# Patient Record
Sex: Male | Born: 2000 | Race: White | Hispanic: No | Marital: Single | Smoking: Never smoker
Health system: Southern US, Community
[De-identification: ages and names within clinical notes are randomized; demographics above are authoritative.]

## PROBLEM LIST (undated history)

## (undated) HISTORY — PX: HYDROCELE EXCISION / REPAIR: SUR1145

## (undated) HISTORY — PX: ASD REPAIR: SHX258

## (undated) HISTORY — PX: HERNIA REPAIR: SHX51

---
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
## 2021-01-28 ENCOUNTER — Emergency Department (HOSPITAL_COMMUNITY): Payer: Medicaid Other

## 2021-01-28 ENCOUNTER — Emergency Department (HOSPITAL_COMMUNITY)
Admission: EM | Admit: 2021-01-28 | Discharge: 2021-01-28 | Disposition: A | Discharge status: Home / Self Care | Payer: Medicaid Other | Attending: Emergency Medicine | Admitting: Emergency Medicine

## 2021-01-28 DIAGNOSIS — H547 Unspecified visual loss: Secondary | ICD-10-CM | POA: Insufficient documentation

## 2021-01-28 DIAGNOSIS — R509 Fever, unspecified: Secondary | ICD-10-CM | POA: Insufficient documentation

## 2021-01-28 DIAGNOSIS — G43909 Migraine, unspecified, not intractable, without status migrainosus: Secondary | ICD-10-CM | POA: Diagnosis not present

## 2021-01-28 DIAGNOSIS — R519 Headache, unspecified: Secondary | ICD-10-CM | POA: Insufficient documentation

## 2021-01-28 DIAGNOSIS — Z5321 Procedure and treatment not carried out due to patient leaving prior to being seen by health care provider: Secondary | ICD-10-CM | POA: Diagnosis not present

## 2021-01-28 LAB — COMPREHENSIVE METABOLIC PANEL
ALT: 21 U/L (ref 0–44)
AST: 24 U/L (ref 15–41)
Albumin: 4.2 g/dL (ref 3.5–5.0)
Alkaline Phosphatase: 73 U/L (ref 38–126)
Anion gap: 9 (ref 5–15)
BUN: 11 mg/dL (ref 6–20)
CO2: 22 mmol/L (ref 22–32)
Calcium: 8.9 mg/dL (ref 8.9–10.3)
Chloride: 106 mmol/L (ref 98–111)
Creatinine, Ser: 1.15 mg/dL (ref 0.61–1.24)
GFR, Estimated: >60 mL/min (ref >60)
Glucose, Bld: 103 mg/dL — ABNORMAL HIGH (ref 70–99)
Potassium: 4 mmol/L (ref 3.5–5.1)
Sodium: 137 mmol/L (ref 135–145)
Total Bilirubin: 1.2 mg/dL (ref 0.3–1.2)
Total Protein: 7.2 g/dL (ref 6.5–8.1)

## 2021-01-28 LAB — LACTIC ACID, PLASMA: Lactic Acid, Venous: 0.7 mmol/L (ref 0.5–1.9)

## 2021-01-28 LAB — CBC WITH DIFFERENTIAL/PLATELET
Abs Immature Granulocytes: 0.01 10*3/uL (ref 0.00–0.07)
Basophils Absolute: 0 10*3/uL (ref 0.0–0.1)
Basophils Relative: 0 %
Eosinophils Absolute: 0.2 10*3/uL (ref 0.0–0.5)
Eosinophils Relative: 3 %
HCT: 48.5 % (ref 39.0–52.0)
Hemoglobin: 16.3 g/dL (ref 13.0–17.0)
Immature Granulocytes: 0 %
Lymphocytes Relative: 14 %
Lymphs Abs: 0.8 10*3/uL (ref 0.7–4.0)
MCH: 29 pg (ref 26.0–34.0)
MCHC: 33.6 g/dL (ref 30.0–36.0)
MCV: 86.3 fL (ref 80.0–100.0)
Monocytes Absolute: 0.8 10*3/uL (ref 0.1–1.0)
Monocytes Relative: 14 %
Neutro Abs: 4 10*3/uL (ref 1.7–7.7)
Neutrophils Relative %: 69 %
Platelets: 157 10*3/uL (ref 150–400)
RBC: 5.62 MIL/uL (ref 4.22–5.81)
RDW: 12 % (ref 11.5–15.5)
WBC: 5.8 10*3/uL (ref 4.0–10.5)
nRBC: 0 % (ref 0.0–0.2)

## 2021-01-28 NOTE — ED Notes (Signed)
Pt stated he wanted to check himself out due to waiting so long. Pt seen leaving out the door

## 2021-01-28 NOTE — ED Triage Notes (Signed)
EMS stated, This morning he was running a fever and loss of vision for 30 seconds. This happened before and dx as migraine headache per mom.

## 2022-02-14 ENCOUNTER — Emergency Department (HOSPITAL_COMMUNITY)
Admission: EM | Admit: 2022-02-14 | Discharge: 2022-02-14 | Disposition: A | Discharge status: Home / Self Care | Payer: Self-pay | Attending: Emergency Medicine | Admitting: Emergency Medicine

## 2022-02-14 ENCOUNTER — Other Ambulatory Visit: Payer: Self-pay

## 2022-02-14 DIAGNOSIS — S61412A Laceration without foreign body of left hand, initial encounter: Secondary | ICD-10-CM | POA: Insufficient documentation

## 2022-02-14 DIAGNOSIS — Z23 Encounter for immunization: Secondary | ICD-10-CM | POA: Insufficient documentation

## 2022-02-14 DIAGNOSIS — Y990 Civilian activity done for income or pay: Secondary | ICD-10-CM | POA: Insufficient documentation

## 2022-02-14 DIAGNOSIS — W260XXA Contact with knife, initial encounter: Secondary | ICD-10-CM | POA: Insufficient documentation

## 2022-02-14 MED ORDER — LIDOCAINE HCL (PF) 1 % IJ SOLN
30.0000 mL | Freq: Once | INTRAMUSCULAR | Status: AC
  Administered 2022-02-14: 30 mL
  Filled 2022-02-14: qty 30

## 2022-02-14 MED ORDER — TETANUS-DIPHTH-ACELL PERTUSSIS 5-2.5-18.5 LF-MCG/0.5 IM SUSY
0.5000 mL | PREFILLED_SYRINGE | Freq: Once | INTRAMUSCULAR | Status: AC
  Administered 2022-02-14: 0.5 mL via INTRAMUSCULAR
  Filled 2022-02-14: qty 0.5

## 2022-02-14 NOTE — Discharge Instructions (Addendum)
I would clean the wound thoroughly with soap and water, and change the bandage once daily, apply thin layer of Polysporin or Neosporin every time that you change the bandage.  Please monitor for signs of infection including worsening redness, swelling, purulence, or worsening pain.  Please return to the emergency department if you have any suspicion for developing infection.

## 2022-02-14 NOTE — ED Provider Notes (Signed)
[facility] EMERGENCY DEPARTMENT Provider Note   CSN: [id number] Arrival date & time: 02/14/22  2159     History  Chief Complaint  Patient presents with   Laceration    Deland JARIUS DIEUDONNE is a 21 y.o. male with noncontributory past medical history who is not up-to-date on his tetanus who presents with laceration to the left palm sustained at work.  Patient works at [employer], reports that he cut his palm with a dirty sandwich knife.  Patient reports bleeding controlled at home with bandage.  He denies any numbness, tingling of the fingers, and is able to bend all the fingers without difficulty.   Laceration      Home Medications Prior to Admission medications   Medication Sig Start Date End Date Taking? Authorizing Provider  b complex vitamins tablet Take 1 tablet by mouth daily. Patient not taking: Reported on 07/19/2019 11/18/18   Keturah Shavers, MD  FLUoxetine (PROZAC) 20 MG capsule Take 1 capsule (20 mg total) by mouth daily. Patient not taking: Reported on 11/18/2018 12/19/16   Gentry Fitz, MD  Magnesium Oxide 500 MG TABS Take 1 tablet (500 mg total) by mouth daily. Patient not taking: Reported on 07/19/2019 11/18/18   Keturah Shavers, MD  Multiple Vitamin (MULTIVITAMIN) tablet Take 1 tablet by mouth daily.    [provider]  VYVANSE 10 MG capsule TAKE 1 CAPSULE BY MOUTH EVERY MORNING FOR ADHD 11/04/18   [provider]      Allergies    Prilosec [omeprazole]    Review of Systems   Review of Systems  Skin:  Positive for wound.  All other systems reviewed and are negative.   Physical Exam Updated Vital Signs BP (!) 157/102   Pulse 75   Temp 98.7 F (37.1 C)   Resp 19   SpO2 99%  Physical Exam Vitals and nursing note reviewed.  Constitutional:      General: He is not in acute distress.    Appearance: Normal appearance.  HENT:     Head: Normocephalic and atraumatic.  Eyes:     General:        Right eye: No discharge.         Left eye: No discharge.  Cardiovascular:     Rate and Rhythm: Normal rate and regular rhythm.     Pulses: Normal pulses.  Pulmonary:     Effort: Pulmonary effort is normal. No respiratory distress.  Musculoskeletal:        General: No deformity.     Comments: Intact strength to flexion, extension of all fingers, opposition of thumb on the affected hand  Skin:    General: Skin is warm and dry.     Capillary Refill: Capillary refill takes less than 2 seconds.     Comments: 2 to 3 cm laceration noted on the left palm oriented horizontally along palm lines, between the first and second digits.  Wound explored through full range of motion, no evidence of foreign body, tendinous damage, or deep muscular damage.  Neurological:     Mental Status: He is alert and oriented to person, place, and time.  Psychiatric:        Mood and Affect: Mood normal.        Behavior: Behavior normal.     ED Results / Procedures / Treatments   Labs (all labs ordered are listed, but only abnormal results are displayed) Labs Reviewed - No data to display  EKG None  Radiology No  results found.  Procedures .Marland KitchenLaceration Repair  Date/Time: 02/14/2022 10:57 PM  Performed by: Olene Floss, PA-C Authorized by: Olene Floss, PA-C   Consent:    Consent obtained:  Verbal   Consent given by:  Patient   Risks, benefits, and alternatives were discussed: yes     Risks discussed:  Infection, pain, poor cosmetic result, poor wound healing, nerve damage and need for additional repair   Alternatives discussed:  No treatment Universal protocol:    Procedure explained and questions answered to patient or proxy's satisfaction: yes     Patient identity confirmed:  Verbally with patient Anesthesia:    Anesthesia method:  Local infiltration   Local anesthetic:  Lidocaine 1% w/o epi Laceration details:    Location:  Hand   Hand location:  L palm   Length (cm):  2.6   Depth (mm):  6 Treatment:     Area cleansed with:  Povidone-iodine   Amount of cleaning:  Standard Skin repair:    Repair method:  Sutures   Suture size:  4-0   Suture material:  Chromic gut   Suture technique:  Simple interrupted   Number of sutures:  3 Repair type:    Repair type:  Simple Post-procedure details:    Dressing:  Antibiotic ointment and non-adherent dressing   Procedure completion:  Tolerated     Medications Ordered in ED Medications  lidocaine (PF) (XYLOCAINE) 1 % injection 30 mL (has no administration in time range)  Tdap (BOOSTRIX) injection 0.5 mL (0.5 mLs Intramuscular Given 02/14/22 2235)    ED Course/ Medical Decision Making/ A&P Clinical Course as of 02/14/22 2258  Fri Feb 14, 2022  2247 2.6cm, 3 stitches chromic 4-0, cleaned with iodine [CP]    Clinical Course User Index [CP] Olene Floss, PA-C                           Medical Decision Making Risk Prescription drug management.   This is an overall well-appearing 21 year old male who presents with concern for laceration to the left palm.  Patient is neurovascularly intact, with no evidence of tendinous damage, no evidence of foreign body.  He is not up-to-date on his tetanus.  Tetanus updated today.  Laceration repaired as described above, patient tolerated without difficulty.  Dissolvable sutures placed, patient informed that he can have them removed in 7 days if they are bothering him.  Instructed to monitor for signs of any infection including worsening redness, pain, swelling.  Patient understands and agrees to plan, and is discharged in stable condition at this time. Final Clinical Impression(s) / ED Diagnoses Final diagnoses:  Laceration of left hand without foreign body, initial encounter    Rx / DC Orders ED Discharge Orders     None         [place] 02/14/22 2258    Terrilee Files, MD 02/15/22 1046

## 2022-02-14 NOTE — ED Triage Notes (Signed)
Pt presents with laceration to left hand obtained while cutting a sandwich at work.  Bleeding controlled. Tetanus not up to date

## 2023-01-03 ENCOUNTER — Encounter (HOSPITAL_COMMUNITY): Payer: Self-pay

## 2023-01-03 ENCOUNTER — Emergency Department (HOSPITAL_COMMUNITY)
Admission: EM | Admit: 2023-01-03 | Discharge: 2023-01-03 | Disposition: A | Discharge status: Home / Self Care | Payer: Medicaid Other | Attending: Emergency Medicine | Admitting: Emergency Medicine

## 2023-01-03 DIAGNOSIS — S50812A Abrasion of left forearm, initial encounter: Secondary | ICD-10-CM | POA: Insufficient documentation

## 2023-01-03 DIAGNOSIS — Y92410 Unspecified street and highway as the place of occurrence of the external cause: Secondary | ICD-10-CM | POA: Insufficient documentation

## 2023-01-03 DIAGNOSIS — S40812A Abrasion of left upper arm, initial encounter: Secondary | ICD-10-CM | POA: Insufficient documentation

## 2023-01-03 DIAGNOSIS — M25512 Pain in left shoulder: Secondary | ICD-10-CM | POA: Diagnosis present
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
## 2023-12-21 ENCOUNTER — Other Ambulatory Visit: Payer: Self-pay

## 2023-12-21 ENCOUNTER — Emergency Department (HOSPITAL_COMMUNITY)
Admission: EM | Admit: 2023-12-21 | Discharge: 2023-12-22 | Disposition: A | Attending: Emergency Medicine | Admitting: Emergency Medicine

## 2023-12-21 ENCOUNTER — Encounter (HOSPITAL_COMMUNITY): Payer: Self-pay | Admitting: Emergency Medicine

## 2023-12-21 DIAGNOSIS — T43692A Poisoning by other psychostimulants, intentional self-harm, initial encounter: Secondary | ICD-10-CM | POA: Insufficient documentation

## 2023-12-21 DIAGNOSIS — R45851 Suicidal ideations: Secondary | ICD-10-CM

## 2023-12-21 DIAGNOSIS — X838XXA Intentional self-harm by other specified means, initial encounter: Secondary | ICD-10-CM | POA: Insufficient documentation

## 2023-12-21 DIAGNOSIS — F332 Major depressive disorder, recurrent severe without psychotic features: Secondary | ICD-10-CM | POA: Insufficient documentation

## 2023-12-21 DIAGNOSIS — F4381 Prolonged grief disorder: Secondary | ICD-10-CM

## 2023-12-21 DIAGNOSIS — F909 Attention-deficit hyperactivity disorder, unspecified type: Secondary | ICD-10-CM | POA: Insufficient documentation

## 2023-12-21 DIAGNOSIS — F329 Major depressive disorder, single episode, unspecified: Secondary | ICD-10-CM

## 2023-12-21 DIAGNOSIS — T50902A Poisoning by unspecified drugs, medicaments and biological substances, intentional self-harm, initial encounter: Secondary | ICD-10-CM

## 2023-12-21 LAB — URINE DRUG SCREEN
Amphetamines: POSITIVE — AB
Barbiturates: NEGATIVE
Benzodiazepines: NEGATIVE
Cocaine: NEGATIVE
Fentanyl: NEGATIVE
Methadone Scn, Ur: NEGATIVE
Opiates: NEGATIVE
Tetrahydrocannabinol: NEGATIVE

## 2023-12-21 LAB — CBC
HCT: 52.5 % — ABNORMAL HIGH (ref 39.0–52.0)
Hemoglobin: 17 g/dL (ref 13.0–17.0)
MCH: 27.9 pg (ref 26.0–34.0)
MCHC: 32.4 g/dL (ref 30.0–36.0)
MCV: 86.1 fL (ref 80.0–100.0)
Platelets: 259 K/uL (ref 150–400)
RBC: 6.1 MIL/uL — ABNORMAL HIGH (ref 4.22–5.81)
RDW: 12.3 % (ref 11.5–15.5)
WBC: 13.3 K/uL — ABNORMAL HIGH (ref 4.0–10.5)
nRBC: 0 % (ref 0.0–0.2)

## 2023-12-21 LAB — COMPREHENSIVE METABOLIC PANEL WITH GFR
ALT: 16 U/L (ref 0–44)
AST: 22 U/L (ref 15–41)
Albumin: 4.9 g/dL (ref 3.5–5.0)
Alkaline Phosphatase: 90 U/L (ref 38–126)
Anion gap: 15 (ref 5–15)
BUN: 14 mg/dL (ref 6–20)
CO2: 22 mmol/L (ref 22–32)
Calcium: 9.5 mg/dL (ref 8.9–10.3)
Chloride: 103 mmol/L (ref 98–111)
Creatinine, Ser: 0.86 mg/dL (ref 0.61–1.24)
GFR, Estimated: >60 mL/min (ref >60)
Glucose, Bld: 121 mg/dL — ABNORMAL HIGH (ref 70–99)
Potassium: 3.6 mmol/L (ref 3.5–5.1)
Sodium: 140 mmol/L (ref 135–145)
Total Bilirubin: 0.8 mg/dL (ref 0.0–1.2)
Total Protein: 7.6 g/dL (ref 6.5–8.1)

## 2023-12-21 LAB — ACETAMINOPHEN LEVEL: Acetaminophen (Tylenol), Serum: <10 ug/mL — ABNORMAL LOW (ref 10–30)

## 2023-12-21 LAB — ETHANOL: Alcohol, Ethyl (B): <15 mg/dL (ref <15)

## 2023-12-21 LAB — SALICYLATE LEVEL: Salicylate Lvl: <7 mg/dL — ABNORMAL LOW (ref 7.0–30.0)

## 2023-12-21 MED ORDER — LACTATED RINGERS IV BOLUS
1000.0000 mL | Freq: Once | INTRAVENOUS | Status: AC
  Administered 2023-12-21: 1000 mL via INTRAVENOUS

## 2023-12-21 MED ORDER — DIAZEPAM 5 MG/ML IJ SOLN
5.0000 mg | Freq: Once | INTRAMUSCULAR | Status: AC
  Administered 2023-12-21: 5 mg via INTRAVENOUS
  Filled 2023-12-21: qty 2

## 2023-12-21 MED ORDER — SODIUM CHLORIDE 0.9 % IV BOLUS
1000.0000 mL | Freq: Once | INTRAVENOUS | Status: AC
Start: 2023-12-21 — End: 2023-12-22
  Administered 2023-12-21: 1000 mL via INTRAVENOUS

## 2023-12-21 MED ORDER — LACTATED RINGERS IV BOLUS
1000.0000 mL | Freq: Once | INTRAVENOUS | Status: AC
  Administered 2023-12-22: 1000 mL via INTRAVENOUS

## 2023-12-21 NOTE — ED Notes (Signed)
 Patient states he took between 84 and 17 tablets unknown dosage unknown if ER or not.

## 2023-12-21 NOTE — ED Notes (Addendum)
 Pt has a green jacket, pink button up shirt,  white T shirt, and black cell phone in one personal belongings bag. Bag 2 has jeans, tennis shoes (white and black) and white socks. Placed in cabinet 13-15 Res B

## 2023-12-21 NOTE — ED Provider Notes (Signed)
  Physical Exam  BP (!) 161/91   Pulse (!) 128   Temp 98.3 F (36.8 C)   Resp 16   SpO2 98%   Physical Exam  Procedures  Procedures  ED Course / MDM   Clinical Course as of 12/21/23 2207  Mon Dec 21, 2023  1622 cbc(!) Reports count elevated.  Metabolic panel unremarkable. [JK]    Clinical Course User Index [JK] Randol Simmonds, MD   Medical Decision Making Amount and/or Complexity of Data Reviewed Labs: ordered. Decision-making details documented in ED Course.  Risk Prescription drug management.   Patient's heart rate has come down nicely with some IV fluids.  No longer appears to be exhibiting the severe symptoms which were reported earlier.  Believe the patient is medically cleared and is appropriate for psychiatry evaluation.       Mannie Pac T, DO 12/21/23 2208

## 2023-12-21 NOTE — ED Notes (Signed)
 Per poison control: EKG, 4hr tylenol level (1600), benzos for agitation or seizures. 4-6hr obs if pills are not extended release, 10-12 if it turns out they are. Mostly watching the tachycardia.

## 2023-12-21 NOTE — ED Notes (Signed)
 Pt's heart rate increased to 160s during ambulation and has been consistently 140s during TTS

## 2023-12-21 NOTE — Progress Notes (Signed)
 Just spoke with poison control and they are not ready to clear him. They would like his HR below 100 and his DBP to be between 85-90 and for him to not have any tremors which he currently has. Notified Fairy Standing, DO and his current RN, Harland Diggs.

## 2023-12-21 NOTE — BH Assessment (Signed)
 Patient deferred to [facility]. Care Coordinator Jon will notify the ED care team once the [facility] provider is ready and available to begin their assessment. If there are any questions, please follow up via secure chat or contact the [facility] Care Coordinator at [phone] for updates. The [facility] ED care team has been informed of the current plan of care.

## 2023-12-21 NOTE — Consult Note (Signed)
 [facility] Consult Note  Patient Name: Shane Walker MRN: [id number] DOB: Nov 25, 2000 DATE OF Consult: 12/21/2023  PRIMARY PSYCHIATRIC DIAGNOSES  1.  Major depressive disorder, severe 2. Prolonged grief 3.  ADHD  RECOMMENDATIONS  Recommendations: Medication recommendations: will different this time because of the significant overdose today Non-Medication/therapeutic recommendations: patient needs resources for outpatient mental health care Is inpatient psychiatric hospitalization recommended for this patient? Yes (Explain why): patient is continuing to endorse suicidal thoughts and he is status post suicide attempt today Follow-Up Telepsychiatry C/L services: We will sign off for now. Please re-consult our service if needed for any concerning changes in the patient's condition, discharge planning, or questions. Communication: Treatment team members (and family members if applicable) who were involved in treatment/care discussions and planning, and with whom we spoke or engaged with via secure text/chat, include the following: treatment team via epic chat  Thank you for involving us  in the care of this patient. If you have any additional questions or concerns, please call [phone] and ask for me or the provider on-call.  TELEPSYCHIATRY ATTESTATION & CONSENT  As the provider for this telehealth consult, I attest that I verified the patient's identity using two separate identifiers, introduced myself to the patient, provided my credentials, disclosed my location, and performed this encounter via a HIPAA-compliant, real-time, face-to-face, two-way, interactive audio and video platform and with the full consent and agreement of the patient (or guardian as applicable.)  Patient physical location: [place] . Telehealth provider physical location: home office in state of [place] .  Video start time: 2110 [facility] Time) Video end time: 2120 (Central Time)  IDENTIFYING DATA  Shane  GEROLD Walker is a 23 y.o. year-old male for whom a psychiatric consultation has been ordered by the primary provider. The patient was identified using two separate identifiers.  CHIEF COMPLAINT/REASON FOR CONSULT  Overdose on Adderall 20 mg, 18 to 20 tablets  HISTORY OF PRESENT ILLNESS (HPI)  The patient is a 23 year old male who had a suicide attempt today on Adderall 20 mg, unknown if extended-release or not, approximately 18 to 20 tablets. He is not yet considered medically clear as he is still tachycardic above 100.  Per chart review poison control does not consider him medically cleared yet.  The patient has a psychiatric history of major depression and ADHD.  He's not currently on any medicine.  He has been depressed for most of his life but it got significantly worse when his mother died in a car accident in 04/27/23. He is been depressed and suicidal since 04-27-2023.  When I asked how come he wasn't getting help he stated he did not know.  When I asked if he had told anyone how he was feeling he stated yes he had but nobody ever did anything.  The patient appears to be emotionally much younger than 30.  He is apparently living with his 54 year old brother.  He has been in college but did not sign up for college this semester.  He has very little insight and is actually a poor Visual merchandiser.  His answers are very short and not explanatory at all.  His expressive language appears to be impaired. For example, when asked why today was the day to kill himself if he's been feeling like this since 04/27/23 his answer was it's just a lot you know. He said there's just too much.  When I asked him specifically what is just a lot I need stated all the things are too much and my mother  dying.  When I asked him to explain what all the things are he was unable to do that. Review of the chart reveals that he was born at 24 weeks.  He did have development delays and received at least OT and PT early on.  He did have an IP  in school and struggled socially.  He indicates that he was getting A's and B's in college when he was going. Patient is not currently on any medications.  He was on Prozac  in the past and said it was not helpful and he does not know if the stimulants were helpful or not. The patient states that he hates himself. The patient stated that he did not want to get help or to feel better. Patient continues to endorse SI.  He denies HI/AVH  PAST PSYCHIATRIC HISTORY  Patient denies any psychiatric hospitalization Patient denies any other suicide attempt before today Patient thinks he went to therapy wants when he was young but has not gone at all this past year.  Otherwise as per HPI above.  PAST MEDICAL HISTORY  History reviewed. No pertinent past medical history.   HOME MEDICATIONS  Facility Ordered Medications  Medication   [COMPLETED] diazepam (VALIUM) injection 5 mg   [COMPLETED] lactated ringers bolus 1,000 mL   [COMPLETED] lactated ringers bolus 1,000 mL   [COMPLETED] diazepam (VALIUM) injection 5 mg   PTA Medications  Medication Sig   FLUoxetine  (PROZAC ) 20 MG capsule Take 1 capsule (20 mg total) by mouth daily. (Patient not taking: Reported on 11/18/2018)   VYVANSE 10 MG capsule TAKE 1 CAPSULE BY MOUTH EVERY MORNING FOR ADHD   Magnesium  Oxide 500 MG TABS Take 1 tablet (500 mg total) by mouth daily. (Patient not taking: Reported on 07/19/2019)   b complex vitamins tablet Take 1 tablet by mouth daily. (Patient not taking: Reported on 07/19/2019)   Multiple Vitamin (MULTIVITAMIN) tablet Take 1 tablet by mouth daily.     ALLERGIES  Allergies  Allergen Reactions   Prilosec [Omeprazole]     SOCIAL & SUBSTANCE USE HISTORY  Social History   Socioeconomic History   Marital status: Single    Spouse name: Not on file   Number of children: Not on file   Years of education: Not on file   Highest education level: Not on file  Occupational History   Not on file  Tobacco Use   Smoking  status: Never   Smokeless tobacco: Never  Substance and Sexual Activity   Alcohol use: No   Drug use: No   Sexual activity: Never  Other Topics Concern   Not on file  Social History Narrative   Lives with mom, her partner and sibling. He is in the 11th grade at [employer]   Social Drivers of Health   Financial Resource Strain: Not on file  Food Insecurity: Not on file  Transportation Needs: Not on file  Physical Activity: Not on file  Stress: Not on file  Social Connections: Not on file   Social History   Tobacco Use  Smoking Status Never  Smokeless Tobacco Never   Social History   Substance and Sexual Activity  Alcohol Use No   Social History   Substance and Sexual Activity  Drug Use No    Additional pertinent information .  FAMILY HISTORY  Family History  Problem Relation Age of Onset   Migraines Mother    ADD / ADHD Mother    Anxiety disorder Mother    Depression  Mother    Seizures Maternal Uncle    ADD / ADHD Maternal Uncle    Autism Neg Hx    Bipolar disorder Neg Hx    Schizophrenia Neg Hx    Family Psychiatric History (if known):  Family Psychiatric History: mother in recovery from drug and alcohol abuse; maternal grandmother self-inflicted GSW (survived); mother's greatuncle with schizophrenia; father and paternal grandfather with depression; half-brother with CAPD and anger issues  MENTAL STATUS EXAM (MSE)  Mental Status Exam: General Appearance: Casual  Orientation:  Full (Time, Place, and Person)  Memory:  Recent;   Fair  Concentration:  Concentration: Poor  Recall:  NA  Attention  Poor  Eye Contact:  Poor  Speech:  Negative and Slow  Language:  no spontaneous speech and his answers to questions were very vague and minimal  Volume:  Normal  Mood: depressed  Affect:  Flat  Thought Process:  vague, seem to be an element of oppositionality, and that any question you asked he would deny that he has any problems in those areas but  with a touch of defensiveness  Thought Content:  Illogical  Suicidal Thoughts:  Yes.  with intent/plan  Homicidal Thoughts:  No  Judgement:  Poor  Insight:  Shallow  Psychomotor Activity:  Decreased  Akathisia:  No  Fund of Knowledge:  Poor    Assets:  Housing  Cognition:  WNL  ADL's:  Intact  AIMS (if indicated):       VITALS  Blood pressure (!) 161/91, pulse (!) 128, temperature 98.3 F (36.8 C), resp. rate 16, SpO2 98%.  LABS  Admission on 12/21/2023  Component Date Value Ref Range Status   Sodium 12/21/2023 140  135 - 145 mmol/L Final   Potassium 12/21/2023 3.6  3.5 - 5.1 mmol/L Final   Chloride 12/21/2023 103  98 - 111 mmol/L Final   CO2 12/21/2023 22  22 - 32 mmol/L Final   Glucose, Bld 12/21/2023 121 (H)  70 - 99 mg/dL Final   Glucose reference range applies only to samples taken after fasting for at least 8 hours.   BUN 12/21/2023 14  6 - 20 mg/dL Final   Creatinine, Ser 12/21/2023 0.86  0.61 - 1.24 mg/dL Final   Calcium 90/70/7974 9.5  8.9 - 10.3 mg/dL Final   Total Protein 90/70/7974 7.6  6.5 - 8.1 g/dL Final   Albumin 90/70/7974 4.9  3.5 - 5.0 g/dL Final   AST 90/70/7974 22  15 - 41 U/L Final   ALT 12/21/2023 16  0 - 44 U/L Final   Alkaline Phosphatase 12/21/2023 90  38 - 126 U/L Final   Total Bilirubin 12/21/2023 0.8  0.0 - 1.2 mg/dL Final   GFR, Estimated 12/21/2023 >60  >60 mL/min Final   Comment: (NOTE) Calculated using the CKD-EPI Creatinine Equation (2021)    Anion gap 12/21/2023 15  5 - 15 Final   Performed at [facility], [address], [place], [place] [address]   Alcohol, Ethyl (B) 12/21/2023 <15  <15 mg/dL Final   Comment: (NOTE) For medical purposes only. Performed at [facility], [address], [place], [place] [address]    WBC 12/21/2023 13.3 (H)  4.0 - 10.5 K/uL Final   RBC 12/21/2023 6.10 (H)  4.22 - 5.81 MIL/uL Final   Hemoglobin 12/21/2023 17.0  13.0 - 17.0 g/dL Final   HCT 90/70/7974 52.5 (H)   39.0 - 52.0 % Final   MCV 12/21/2023 86.1  80.0 -  100.0 fL Final   MCH 12/21/2023 27.9  26.0 - 34.0 pg Final   MCHC 12/21/2023 32.4  30.0 - 36.0 g/dL Final   RDW 90/70/7974 12.3  11.5 - 15.5 % Final   Platelets 12/21/2023 259  150 - 400 K/uL Final   nRBC 12/21/2023 0.0  0.0 - 0.2 % Final   Performed at [facility], [address], [place], [place] [address]   Opiates 12/21/2023 NEGATIVE  NEGATIVE Final   Cocaine 12/21/2023 NEGATIVE  NEGATIVE Final   Benzodiazepines 12/21/2023 NEGATIVE  NEGATIVE Final   Amphetamines 12/21/2023 POSITIVE (A)  NEGATIVE Final   Tetrahydrocannabinol 12/21/2023 NEGATIVE  NEGATIVE Final   Barbiturates 12/21/2023 NEGATIVE  NEGATIVE Final   Methadone Scn, Ur 12/21/2023 NEGATIVE  NEGATIVE Final   Fentanyl 12/21/2023 NEGATIVE  NEGATIVE Final   Comment: (NOTE) Drug screen is for Medical Purposes only. Positive results are preliminary only. If confirmation is needed, notify lab within 5 days.  Drug Class                 Cutoff (ng/mL) Amphetamine and metabolites 1000 Barbiturate and metabolites 200 Benzodiazepine              200 Opiates and metabolites     300 Cocaine and metabolites     300 THC                         50 Fentanyl                    5 Methadone                   300  Trazodone is metabolized in vivo to several metabolites,  including pharmacologically active m-CPP, which is excreted in the  urine.  Immunoassay screens for amphetamines and MDMA have potential  cross-reactivity with these compounds and may provide false positive  result.  Performed at [facility], [address], [place], [place] [address]    Salicylate Lvl 12/21/2023 <7.0 (L)  7.0 - 30.0 mg/dL Final   Performed at [facility], [address], [place], [place] [address]   Acetaminophen (Tylenol), Serum 12/21/2023 <10 (L)  10 - 30 ug/mL Final   Comment: (NOTE) Toxic concentrations can be more effectively related  to post dose interval; > 200, > 100, and > 50 ug/mL serum concentrations correspond to toxic concentrations at 4, 8, and 12 hours post dose, respectively.  Performed at [facility], [address], [place], [place] [address]     PSYCHIATRIC REVIEW OF SYSTEMS (ROS)  ROS: Notable for the following relevant positive findings: ROS  Additional findings:      Musculoskeletal: No abnormal movements observed      Gait & Station: Laying/Sitting      Pain Screening: Denies      Nutrition & Dental Concerns: no concerns  RISK FORMULATION/ASSESSMENT  Is the patient experiencing any suicidal or homicidal ideations: Yes       Explain if yes: patient continues to endorse feeling suicidal Protective factors considered for safety management: patient is being monitored in the ED  Risk factors/concerns considered for safety management:  Depression Recent loss Hopelessness Impulsivity Unwillingness to seek help Male gender  Is there a safety management plan with the patient and treatment team to minimize risk factors and promote protective factors: Yes           Explain: patient to  be monitored in the ED Is crisis care placement or psychiatric hospitalization recommended: Yes     Based on my current evaluation and risk assessment, patient is determined at this time to be at:  High risk  *RISK ASSESSMENT Risk assessment is a dynamic process; it is possible that this patient's condition, and risk level, may change. This should be re-evaluated and managed over time as appropriate. Please re-consult psychiatric consult services if additional assistance is needed in terms of risk assessment and management. If your team decides to discharge this patient, please advise the patient how to best access emergency psychiatric services, or to call 911, if their condition worsens or they feel unsafe in any way.   Elvina Bosch A Taveon Enyeart, NP Telepsychiatry Consult Services

## 2023-12-21 NOTE — ED Notes (Signed)
 Pt placed on portable heart monitor. Rate 123

## 2023-12-21 NOTE — ED Provider Notes (Signed)
 [place] EMERGENCY DEPARTMENT AT [facility] Provider Note   CSN: [id number] Arrival date & time: 12/21/23  1325     Patient presents with: Suicidal and Drug Overdose   Shane Walker is a 23 y.o. male.    Drug Overdose     Patient resents to the ED for evaluation of suicidal ideation.  Patient states he took 18 to 2020 mg Adderall.  Patient's mother passed away in 2023/04/06 and he has been very upset.  Patient has very rapid speech and is perseverating and tangential.  It is hard to get a clear history from him as he will start to answer questions and then repeat the same thing over and over again.  Prior to Admission medications   Medication Sig Start Date End Date Taking? Authorizing Provider  b complex vitamins tablet Take 1 tablet by mouth daily. Patient not taking: Reported on 07/19/2019 11/18/18   Corinthia Blossom, MD  FLUoxetine  (PROZAC ) 20 MG capsule Take 1 capsule (20 mg total) by mouth daily. Patient not taking: Reported on 11/18/2018 12/19/16   Philis Luke MATSU, MD  Magnesium  Oxide 500 MG TABS Take 1 tablet (500 mg total) by mouth daily. Patient not taking: Reported on 07/19/2019 11/18/18   Corinthia Blossom, MD  Multiple Vitamin (MULTIVITAMIN) tablet Take 1 tablet by mouth daily.    [provider]  VYVANSE 10 MG capsule TAKE 1 CAPSULE BY MOUTH EVERY MORNING FOR ADHD 11/04/18   [provider]    Allergies: Prilosec [omeprazole]    Review of Systems  Updated Vital Signs BP (!) 167/97   Pulse (!) 109   Temp 98 F (36.7 C) (Oral)   Resp (!) 21   SpO2 97%   Physical Exam Vitals and nursing note reviewed.  Constitutional:      General: He is not in acute distress.    Appearance: He is well-developed. He is not ill-appearing.  HENT:     Head: Normocephalic and atraumatic.     Right Ear: External ear normal.     Left Ear: External ear normal.  Eyes:     General: No scleral icterus.       Right eye: No discharge.        Left eye: No  discharge.     Conjunctiva/sclera: Conjunctivae normal.  Neck:     Trachea: No tracheal deviation.  Cardiovascular:     Rate and Rhythm: Tachycardia present.  Pulmonary:     Effort: Pulmonary effort is normal. No respiratory distress.     Breath sounds: No stridor.  Abdominal:     General: There is no distension.  Musculoskeletal:        General: No swelling or deformity.     Cervical back: Neck supple.  Skin:    General: Skin is warm and dry.     Findings: No rash.  Neurological:     Mental Status: He is alert. Mental status is at baseline.     Cranial Nerves: No cranial nerve deficit, dysarthria or facial asymmetry.     Motor: No weakness or seizure activity.  Psychiatric:        Mood and Affect: Mood is anxious.        Speech: Speech is rapid and pressured and tangential.        Behavior: Behavior is hyperactive.        Thought Content: Thought content includes suicidal ideation.     (all labs ordered are listed, but only abnormal results are displayed)  Labs Reviewed  COMPREHENSIVE METABOLIC PANEL WITH GFR - Abnormal; Notable for the following components:      Result Value   Glucose, Bld 121 (*)    All other components within normal limits  CBC - Abnormal; Notable for the following components:   WBC 13.3 (*)    RBC 6.10 (*)    HCT 52.5 (*)    All other components within normal limits  SALICYLATE LEVEL - Abnormal; Notable for the following components:   Salicylate Lvl <7.0 (*)    All other components within normal limits  ACETAMINOPHEN LEVEL - Abnormal; Notable for the following components:   Acetaminophen (Tylenol), Serum <10 (*)    All other components within normal limits  ETHANOL  URINE DRUG SCREEN    EKG: EKG Interpretation Date/Time:  Monday December 21 2023 13:36:42 EDT Ventricular Rate:  158 PR Interval:    QRS Duration:  75 QT Interval:  295 QTC Calculation: 479 R Axis:   82  Text Interpretation: sinus tachycardia Ventricular premature complex  Repol abnrm suggests ischemia, diffuse leads Confirmed by Shane Simmonds [phone]) on 12/21/2023 3:22:12 PM  Radiology: No results found.   Procedures   Medications Ordered in the ED  diazepam (VALIUM) injection 5 mg (5 mg Intravenous Given 12/21/23 1502)  lactated ringers bolus 1,000 mL (1,000 mLs Intravenous New Bag/Given 12/21/23 1511)    Clinical Course as of 12/21/23 1647  Mon Dec 21, 2023  1622 cbc(!) Reports count elevated.  Metabolic panel unremarkable. [JK]    Clinical Course User Index [JK] Shane Simmonds, MD                                 Medical Decision Making Problems Addressed: Intentional overdose, initial encounter [facility]): acute illness or injury that poses a threat to life or bodily functions Suicidal ideation: acute illness or injury that poses a threat to life or bodily functions  Amount and/or Complexity of Data Reviewed Labs: ordered. Decision-making details documented in ED Course.  Risk Prescription drug management.   Patient presents ED with complaints of suicidal ideation overdose of Adderall.  In the ED patient's presentation is consistent with Adderall toxicity.  Patient is hyperactive, tangential.  He was noted to be tachycardic on arrival.  Initial labs do not show any signs of acetaminophen toxicity or salicylate toxicity.  Labs CBC and metabolic panel are otherwise unremarkable.  Will continue to monitor in the ED.  Poison center was consulted.  Patient was given a dose of Valium for anxiolysis.  The patient has been placed in psychiatric observation due to the need to provide a safe environment for the patient while obtaining psychiatric consultation and evaluation, as well as ongoing medical and medication management to treat the patient's condition.  The patient has not been placed under full IVC at this time.  Care turned over to oncoming md at shift change.      Final diagnoses:  Suicidal ideation  Intentional overdose, initial encounter [facility])     ED Discharge Orders     None          Shane Simmonds, MD 12/21/23 [phone]

## 2023-12-21 NOTE — ED Triage Notes (Signed)
 Patient BIB EMS c/o SI. Patient report he took 18-20 pcs of 20mg  adderall. Patient stated Im so tired living I just want to end everything. Patient stated medication that he took was from her Mom that passed. Poison controlled notifed by EMS. Patient 160-170's HR in triage.

## 2023-12-22 ENCOUNTER — Encounter (HOSPITAL_COMMUNITY): Payer: Self-pay | Admitting: Psychiatry

## 2023-12-22 ENCOUNTER — Inpatient Hospital Stay (HOSPITAL_COMMUNITY)
Admission: AD | Admit: 2023-12-22 | Discharge: 2023-12-26 | DRG: 885 | Disposition: A | Discharge status: Home / Self Care | Source: Intra-hospital

## 2023-12-22 ENCOUNTER — Other Ambulatory Visit: Payer: Self-pay

## 2023-12-22 DIAGNOSIS — F909 Attention-deficit hyperactivity disorder, unspecified type: Secondary | ICD-10-CM | POA: Diagnosis present

## 2023-12-22 DIAGNOSIS — F322 Major depressive disorder, single episode, severe without psychotic features: Principal | ICD-10-CM | POA: Diagnosis present

## 2023-12-22 DIAGNOSIS — R000 Tachycardia, unspecified: Secondary | ICD-10-CM | POA: Diagnosis present

## 2023-12-22 DIAGNOSIS — Z818 Family history of other mental and behavioral disorders: Secondary | ICD-10-CM

## 2023-12-22 DIAGNOSIS — Z634 Disappearance and death of family member: Secondary | ICD-10-CM

## 2023-12-22 DIAGNOSIS — Z23 Encounter for immunization: Secondary | ICD-10-CM | POA: Diagnosis not present

## 2023-12-22 DIAGNOSIS — F4381 Prolonged grief disorder: Secondary | ICD-10-CM | POA: Diagnosis present

## 2023-12-22 DIAGNOSIS — F419 Anxiety disorder, unspecified: Secondary | ICD-10-CM | POA: Diagnosis present

## 2023-12-22 DIAGNOSIS — R011 Cardiac murmur, unspecified: Secondary | ICD-10-CM | POA: Diagnosis present

## 2023-12-22 DIAGNOSIS — Z560 Unemployment, unspecified: Secondary | ICD-10-CM | POA: Diagnosis not present

## 2023-12-22 DIAGNOSIS — F4321 Adjustment disorder with depressed mood: Secondary | ICD-10-CM | POA: Diagnosis not present

## 2023-12-22 DIAGNOSIS — G4700 Insomnia, unspecified: Secondary | ICD-10-CM | POA: Diagnosis present

## 2023-12-22 DIAGNOSIS — R6250 Unspecified lack of expected normal physiological development in childhood: Secondary | ICD-10-CM | POA: Diagnosis present

## 2023-12-22 DIAGNOSIS — Z9151 Personal history of suicidal behavior: Secondary | ICD-10-CM | POA: Diagnosis not present

## 2023-12-22 DIAGNOSIS — F329 Major depressive disorder, single episode, unspecified: Secondary | ICD-10-CM | POA: Diagnosis not present

## 2023-12-22 DIAGNOSIS — Z888 Allergy status to other drugs, medicaments and biological substances status: Secondary | ICD-10-CM | POA: Diagnosis not present

## 2023-12-22 MED ORDER — MAGNESIUM HYDROXIDE 400 MG/5ML PO SUSP: 30.0000 mL | Freq: Every day | ORAL | Status: DC | PRN

## 2023-12-22 MED ORDER — INFLUENZA VIRUS VACC SPLIT PF (FLUZONE) 0.5 ML IM SUSY
0.5000 mL | PREFILLED_SYRINGE | INTRAMUSCULAR | Status: AC
  Administered 2023-12-23: 0.5 mL via INTRAMUSCULAR
  Filled 2023-12-22: qty 0.5

## 2023-12-22 MED ORDER — FLUOXETINE HCL 20 MG PO CAPS
20.0000 mg | ORAL_CAPSULE | Freq: Every day | ORAL | Status: DC
Start: 2023-12-22 — End: 2023-12-26
  Administered 2023-12-22 – 2023-12-26 (×5): 20 mg via ORAL
  Filled 2023-12-22 (×5): qty 1

## 2023-12-22 MED ORDER — HALOPERIDOL LACTATE 5 MG/ML IJ SOLN: 10.0000 mg | Freq: Three times a day (TID) | INTRAMUSCULAR | Status: DC | PRN

## 2023-12-22 MED ORDER — HALOPERIDOL LACTATE 5 MG/ML IJ SOLN: 5.0000 mg | Freq: Three times a day (TID) | INTRAMUSCULAR | Status: DC | PRN

## 2023-12-22 MED ORDER — HALOPERIDOL 5 MG PO TABS: 5.0000 mg | ORAL_TABLET | Freq: Three times a day (TID) | ORAL | Status: DC | PRN

## 2023-12-22 MED ORDER — DIPHENHYDRAMINE HCL 50 MG/ML IJ SOLN: 50.0000 mg | Freq: Three times a day (TID) | INTRAMUSCULAR | Status: DC | PRN

## 2023-12-22 MED ORDER — LORAZEPAM 2 MG/ML IJ SOLN: 2.0000 mg | Freq: Three times a day (TID) | INTRAMUSCULAR | Status: DC | PRN

## 2023-12-22 MED ORDER — ALUM & MAG HYDROXIDE-SIMETH 200-200-20 MG/5ML PO SUSP: 30.0000 mL | ORAL | Status: DC | PRN

## 2023-12-22 MED ORDER — ACETAMINOPHEN 325 MG PO TABS: 650.0000 mg | ORAL_TABLET | Freq: Four times a day (QID) | ORAL | Status: DC | PRN

## 2023-12-22 MED ORDER — DIPHENHYDRAMINE HCL 25 MG PO CAPS: 50.0000 mg | ORAL_CAPSULE | Freq: Three times a day (TID) | ORAL | Status: DC | PRN

## 2023-12-22 MED ORDER — LISDEXAMFETAMINE DIMESYLATE 10 MG PO CAPS
10.0000 mg | ORAL_CAPSULE | Freq: Every day | ORAL | Status: DC
  Administered 2023-12-22: 10 mg via ORAL
  Filled 2023-12-22 (×2): qty 1

## 2023-12-22 NOTE — ED Notes (Signed)
 Pt ambulatory to restroom and back to room. Heart rate around 110 on ambulation.

## 2023-12-22 NOTE — Tx Team (Signed)
 Initial Treatment Plan 12/22/2023 4:40 PM Shane Walker [id number]    PATIENT STRESSORS: Health problems   Loss of Parent (Mother)     PATIENT STRENGTHS: Ability for insight  Communication skills  Motivation for treatment/growth    PATIENT IDENTIFIED PROBLEMS: To stop having these thoughts  Suicidal thoughts  Anxiety  Depression  Ineffective coping skills             DISCHARGE CRITERIA:  Ability to meet basic life and health needs Adequate post-discharge living arrangements Motivation to continue treatment in a less acute level of care  PRELIMINARY DISCHARGE PLAN: Attend aftercare/continuing care group Attend 12-step recovery group Outpatient therapy  PATIENT/FAMILY INVOLVEMENT: This treatment plan has been presented to and reviewed with the patient, Shane Walker, and/or family member.  The patient and family have been given the opportunity to ask questions and make suggestions.  Shane MALVA Lowers, RN 12/22/2023, 4:40 PM

## 2023-12-22 NOTE — Plan of Care (Signed)
   Problem: Education: Goal: Knowledge of [facility] Education information/materials will improve Outcome: Progressing Goal: Emotional status will improve Outcome: Progressing Goal: Mental status will improve Outcome: Progressing Goal: Verbalization of understanding the information provided will improve Outcome: Progressing   Problem: Activity: Goal: Interest or engagement in activities will improve Outcome: Progressing

## 2023-12-22 NOTE — Progress Notes (Signed)
 Admission Note: Patient is a 23 years old male admitted to the unit voluntarily from [facility] for a suicide attempt after taking 18 to 20 tablets of 20 mg Adderall.  Patient stated his mother died in a car accident in 06-27-2023.  Stated he has been depressed since.  Reports worsening depression for about a month.  Stated he wants to learn coping skills so he can stop having suicidal thoughts.  Admission plan of care reviewed, consent signed.  Skin and personal belongings completed.  Skin is dry and intact.  No contraband found.  Patient oriented to the unit, room and staff.   Routine safety checks initiated.  Verbalizes understanding of unit rules/protocols.  Patient is safe on the unit.

## 2023-12-22 NOTE — ED Provider Notes (Addendum)
 Emergency Medicine Observation Re-evaluation Note  Shane Walker is a 23 y.o. male, seen on rounds today.  Pt initially presented to the ED for complaints of Suicidal and Drug Overdose Currently, the patient is resting, comfortably.  Physical Exam  BP 128/83 (BP Location: Right Arm)   Pulse 77   Temp 98.1 F (36.7 C) (Oral)   Resp 16   SpO2 99%  Physical Exam General: Awake. Alert. No acute distress Cardiac: Regular rate rhythm Lungs: Clear to auscultation bilaterally Psych: Calm and cooperative  ED Course / MDM  EKG:EKG Interpretation Date/Time:  Monday December 21 2023 13:36:42 EDT Ventricular Rate:  158 PR Interval:    QRS Duration:  75 QT Interval:  295 QTC Calculation: 479 R Axis:   82  Text Interpretation: sinus tachycardia Ventricular premature complex Repol abnrm suggests ischemia, diffuse leads Confirmed by Randol Simmonds [phone]) on 12/21/2023 3:22:12 PM  I have reviewed the labs performed to date as well as medications administered while in observation.  Recent changes in the last 24 hours include medically cleared after intentional Adderall overdose.  Plan  Current plan is for continued boarding in the ED awaiting placement.  1523: Pt will be admitted to [facility] for inpatient psychiatric hospitalization    Pamella Ozell LABOR, DO 12/22/23 0932    Pamella Ozell LABOR, DO 12/22/23 1524

## 2023-12-22 NOTE — ED Notes (Signed)
 Spoke to poison control who has medically cleared pt for psychiatric placement

## 2023-12-22 NOTE — Progress Notes (Signed)
 Pt has been accepted to [facility] on 12/22/2023 Bed assignment: 305-02  Pt meets inpatient criteria per: Julie Tenbrook NP  Attending Physician will be: Jadeka Mangrum NP  Report can be called un:lwpu: Adult unit: [phone]  Pt can arrive after [facility] will update   Care Team Notified: [facility] Danika Carlo RN, Cathaleen Mangrum NP  [place] Denisa Enterline LCSW-A   12/22/2023 9:17 AM

## 2023-12-22 NOTE — Group Note (Signed)
 Date:  12/22/2023 Time:  8:57 PM  Group Topic/Focus:  Wrap-Up Group:   The focus of this group is to help patients review their daily goal of treatment and discuss progress on daily workbooks.    Participation Level:  Active  Participation Quality:  Appropriate and Sharing  Affect:  Appropriate and Irritable  Cognitive:  Appropriate  Insight: Appropriate  Engagement in Group:  Engaged  Modes of Intervention:  Activity and Socialization  Additional Comments:  Patient activity engaged in wrap up group and shared. Patient shared with group, something good that happened today, something they are looking for to, and a challenge they faced today. Patient also participated in ice breaker group activity after sharing.   Eward Mace 12/22/2023, 8:57 PM

## 2023-12-23 ENCOUNTER — Encounter (HOSPITAL_COMMUNITY): Payer: Self-pay

## 2023-12-23 LAB — URINALYSIS, COMPLETE (UACMP) WITH MICROSCOPIC
Bacteria, UA: NONE SEEN
Bilirubin Urine: NEGATIVE
Glucose, UA: NEGATIVE mg/dL
Hgb urine dipstick: NEGATIVE
Ketones, ur: NEGATIVE mg/dL
Leukocytes,Ua: NEGATIVE
Nitrite: NEGATIVE
Protein, ur: NEGATIVE mg/dL
Specific Gravity, Urine: 1.023 (ref 1.005–1.030)
pH: 5 (ref 5.0–8.0)

## 2023-12-23 LAB — HEMOGLOBIN A1C
Hgb A1c MFr Bld: 4.6 % — ABNORMAL LOW (ref 4.8–5.6)
Mean Plasma Glucose: 85.32 mg/dL

## 2023-12-23 LAB — VITAMIN D 25 HYDROXY (VIT D DEFICIENCY, FRACTURES): Vit D, 25-Hydroxy: 18.89 ng/mL — ABNORMAL LOW (ref 30–100)

## 2023-12-23 LAB — TSH: TSH: 1.33 u[IU]/mL (ref 0.350–4.500)

## 2023-12-23 MED ORDER — HYDROXYZINE HCL 25 MG PO TABS
25.0000 mg | ORAL_TABLET | Freq: Three times a day (TID) | ORAL | Status: DC | PRN
  Administered 2023-12-23 (×2): 25 mg via ORAL
  Filled 2023-12-23 (×3): qty 1

## 2023-12-23 MED ORDER — TRAZODONE HCL 50 MG PO TABS
50.0000 mg | ORAL_TABLET | Freq: Every evening | ORAL | Status: DC | PRN
  Administered 2023-12-23: 50 mg via ORAL
  Filled 2023-12-23 (×2): qty 1

## 2023-12-23 NOTE — Progress Notes (Signed)
(  Sleep Hours) - 1.75 (Any PRNs that were needed, meds refused, or side effects to meds)- PRN vistaril 25 mg given at pt request, no meds refused.  (Any disturbances and when (visitation, over night)- None  (Concerns raised by the patient)- None  (SI/HI/AVH)- Denies SI/HI/AVH

## 2023-12-23 NOTE — Group Note (Signed)
 Date:  12/23/2023 Time:  9:04 PM  Group Topic/Focus:  AA/NA Group    Participation Level:  Active  Participation Quality:  Appropriate  Affect:  Appropriate  Cognitive:  Appropriate  Insight: Appropriate  Engagement in Group:  Engaged  Modes of Intervention:  Discussion  Additional Comments:  Patient attended AA/NA meeting   Bari Moats 12/23/2023, 9:04 PM

## 2023-12-23 NOTE — Progress Notes (Addendum)
 D:  Patient has fair sleep, no sleep medication.  Poor appetite, low energy level, poor concentration.  Rated depression 8, hopeless 5, anxiety 10.  Denied withdrawals.  Denied SI.  Denied physical problems.  Denied physical pain.  Goal is be better for myself.  Plans to attempt to do more.  I have a heart murmur.  Never taken vyvanse.  Does have discharge plans. A:  Medications administered per MD orders.  Emotional support and encouragement given patient. R:  Denied SI and HI, contracts for safety.  Denied A/V hallucinations.  Safety maintained with 15 minute checks. EKG completed and given to MD for review.

## 2023-12-23 NOTE — Group Note (Signed)
 Recreation Therapy Group Note   Group Topic:Other  Group Date: 12/23/2023 Start Time: 0930 End Time: 1000 Facilitators: Amani Nodarse-McCall, LRT,CTRS Location: [address] Dayroom   Group Topic: Communication, Problem Solving   Goal Area(s) Addresses:  Patient will effectively listen to complete activity.  Patient will identify communication skills used to make activity successful.  Patient will identify how skills used during activity can be used to reach post d/c goals.    Intervention: Building surveyor Activity - Geometric pattern cards, pencils, blank paper    Activity: Geometric Drawings.  Three volunteers from the peer group will be shown an abstract picture with a particular arrangement of geometrical shapes.  Each round, one 'speaker' will describe the pattern, as accurately as possible without revealing the image to the group.  The remaining group members will listen and draw the picture to reflect how it is described to them. Patients with the role of 'listener' cannot ask clarifying questions but, may request that the speaker repeat a direction. Once the drawings are complete, the presenter will show the rest of the group the picture and compare how close each person came to drawing the picture. LRT will facilitate a post-activity discussion regarding effective communication and the importance of planning, listening, and asking for clarification in daily interactions with others.  Education: Environmental consultant, Active listening, Support systems, Discharge planning  Education Outcome: Acknowledges understanding/In group clarification offered/Needs additional education.    Affect/Mood: N/A   Participation Level: N/A    Clinical Observations/Individualized Feedback: Recreation therapy group session did not occur due to previous going time.     Plan: Continue to engage patient in RT group sessions 2-3x/week.   Azion Centrella-McCall, LRT,CTRS 12/23/2023 1:28 PM

## 2023-12-23 NOTE — Progress Notes (Signed)
 Patient wants to talk to MD before taking prozac  and vyvanse.  Said he had never taken these meds before.  Has history of heart murmur.

## 2023-12-23 NOTE — Group Note (Signed)
 Date:  12/23/2023 Time:  3:38 PM  Group Topic/Focus: Sleep Hygiene Dimensions of Wellness:   The focus of this group is to introduce the topic of wellness and discuss the role each dimension of wellness plays in total health.    Participation Level:  Did Not Attend   Shanda JONETTA Challenger 12/23/2023, 3:38 PM

## 2023-12-23 NOTE — [facility] Suicide Risk Assessment (Signed)
 Suicide Risk Assessment  Admission Assessment    [facility] Admission Suicide Risk Assessment   Nursing information obtained from:  Patient Demographic factors:  Male, Low socioeconomic status, Adolescent or young adult Current Mental Status:  Self-harm thoughts Loss Factors:  Loss of significant relationship Historical Factors:  Impulsivity Risk Reduction Factors:  Living with another person, especially a relative  Total Time spent with patient: 45 minutes  Principal Problem: MDD (major depressive disorder) Diagnosis:  Principal Problem:   MDD (major depressive disorder)  Subjective Data:  This is patient's first inpatient psychiatric admission to [facility].  Shane Walker is a 23 year old Caucasian male with self-reported history of ADHD, prolonged grief, and major depressive disorder, who presents voluntarily to [facility] from [place] health ED at [facility] for worsening depression resulting in suicidal attempts by overdosing on 18 to 20 capsules of Adderall prescribed for his ADHD in the context of psychosocial stressors.  UDS positive for amphetamine, possibly due to Adderall, and BAL less than 15.  Continued Clinical Symptoms:  Alcohol Use Disorder Identification Test Final Score (AUDIT): 0 The Alcohol Use Disorders Identification Test, Guidelines for Use in Primary Care, Second Edition.  World Science writer [facility]). Score between 0-7:  no or low risk or alcohol related problems. Score between 8-15:  moderate risk of alcohol related problems. Score between 16-19:  high risk of alcohol related problems. Score 20 or above:  warrants further diagnostic evaluation for alcohol dependence and treatment.  CLINICAL FACTORS:   Severe Anxiety and/or Agitation Depression:   Anhedonia Impulsivity Insomnia Severe More than one psychiatric diagnosis Medical Diagnoses and Treatments/Surgeries  Musculoskeletal: Strength & Muscle Tone: within normal  limits Gait & Station: normal Patient leans: N/A  Psychiatric Specialty Exam:  Presentation  General Appearance:  Casual  Eye Contact: Fair  Speech: Clear and Coherent  Speech Volume:No data recorded Handedness: Right   Mood and Affect  Mood: Anxious; Depressed  Affect: Congruent  Thought Process  Thought Processes: Coherent  Descriptions of Associations:Intact  Orientation:Full (Time, Place and Person)  Thought Content:Logical  History of Schizophrenia/Schizoaffective disorder:No data recorded Duration of Psychotic Symptoms:No data recorded Hallucinations:Hallucinations: None  Ideas of Reference:None  Suicidal Thoughts:Suicidal Thoughts: No  Homicidal Thoughts:Homicidal Thoughts: No  Sensorium  Memory: Immediate Fair; Recent Good  Judgment: Fair  Insight: Fair  Art therapist  Concentration: Fair  Attention Span: Fair  Recall: [employer] of Knowledge: Fair  Language: Fair  Psychomotor Activity  Psychomotor Activity: Psychomotor Activity: Normal  Assets  Assets: Communication Skills; Physical Health; Resilience; Social Support  Sleep  Sleep: Sleep: Poor Number of Hours of Sleep: 1.75  Physical Exam: Physical Exam Vitals and nursing note reviewed.  Constitutional:      General: He is not in acute distress.    Appearance: He is normal weight. He is not ill-appearing.  HENT:     Head: Normocephalic.     Right Ear: External ear normal.     Left Ear: External ear normal.     Mouth/Throat:     Mouth: Mucous membranes are moist.     Pharynx: Oropharynx is clear.  Eyes:     Extraocular Movements: Extraocular movements intact.  Cardiovascular:     Rate and Rhythm: Tachycardia present.     Comments: Blood pressure 125/88, pulse rate 199.  Patient is asymptomatic.  Nursing staff to recheck vital signs Pulmonary:     Effort: Pulmonary effort is normal. No respiratory distress.  Abdominal:     Comments:  Deferred   Genitourinary:    Comments: Deferred Musculoskeletal:        General: Normal range of motion.     Cervical back: Normal range of motion.  Skin:    General: Skin is dry.  Neurological:     General: No focal deficit present.     Mental Status: He is alert and oriented to person, place, and time.  Psychiatric:        Mood and Affect: Mood normal.        Behavior: Behavior normal.    Review of Systems  Constitutional:  Negative for chills and fever.  HENT:  Negative for sore throat.   Eyes:  Negative for blurred vision.  Respiratory:  Negative for cough, sputum production, shortness of breath and wheezing.   Cardiovascular:  Negative for chest pain and palpitations.  Gastrointestinal:  Negative for heartburn and nausea.  Genitourinary:  Negative for dysuria and urgency.  Musculoskeletal:  Negative for falls.  Skin:  Negative for rash.  Neurological:  Negative for dizziness and headaches.  Endo/Heme/Allergies:        See allergy listing  Psychiatric/Behavioral:  Positive for depression. Negative for hallucinations, substance abuse and suicidal ideas. The patient is nervous/anxious. The patient does not have insomnia.    Blood pressure 125/82, pulse (!) 119, temperature 98 F (36.7 C), temperature source Oral, resp. rate 16, height 5' 7 (1.702 m), weight 60.8 kg, SpO2 100%. Body mass index is 20.99 kg/m.   COGNITIVE FEATURES THAT CONTRIBUTE TO RISK:  Polarized thinking    SUICIDE RISK:   Severe:  Frequent, intense, and enduring suicidal ideation, specific plan, no subjective intent, but some objective markers of intent (i.e., choice of lethal method), the method is accessible, some limited preparatory behavior, evidence of impaired self-control, severe dysphoria/symptomatology, multiple risk factors present, and few if any protective factors, particularly a lack of social support.  PLAN OF CARE: Treatment Plan Summary: Daily contact with patient to assess and evaluate symptoms  and progress in treatment and Medication management  Observation Level/Precautions:  15 minute checks  Laboratory:   CBC with differential: WBC 13.3, RBC 6.10, HCT 52.5, otherwise normal Chemistry Profile: Glucose 121, otherwise normal HbAIC: Ordered HCG: Not applicable UDS: Positive for amphetamine, possibly due to overdose on Adderall UA: Ordered Vitamin D 25-hydroxy: Ordered TSH: Ordered  EKG: Sinus tachycardia, rate 110, QT/QTc 303/410  Psychotherapy: Therapeutic milieu  Medications: See MAR  Consultations: Pending  Discharge Concerns: Safety  Estimated LOS: 3 to 7 days  Other:     Assessment: This is patient's first inpatient psychiatric admission to [facility].  Shane Walker is a 23 year old Caucasian male with self-reported history of ADHD, prolonged grief, and major depressive disorder, who presents voluntarily to [facility] from [facility] health ED at [facility] for worsening depression resulting in suicidal attempts by overdosing on 18 to 20 capsules of Adderall prescribed for his ADHD in the context of psychosocial stressors.  UDS positive for amphetamine, possibly due to Adderall, and BAL less than 15.  Physician Treatment Plan for Primary Diagnosis: MDD (major depressive disorder)  Plans: Medications: --Continue Prozac  capsule 20 mg p.o. daily for depression and anxiety.  May titrate capsule as needed depending on patient's symptoms --Continue Hydroxyzine tablet 25 mg p.o. 3 times daily as needed for anxiety -- Initiate Trazodone 50 mg p.o. q. nightly as needed for sleep  Medication for other medical: None  Continue BH Agitation Protocol   Other PRN Medications  -Acetaminophen  650 mg every 6 as needed/mild pain  -Maalox 30 mL oral every 4 as needed/digestion  -Magnesium  hydroxide 30 mL daily as needed/mild constipation   --The risks/benefits/side-effects/alternatives to this medication were discussed in detail with the patient and time  was given for questions.  The patient consents to medication trial.   -- Metabolic profile and EKG monitoring obtained while on an atypical antipsychotic (BMI: Lipid Panel: HbgA1c: QTc:)   -- Encouraged patient to participate in unit milieu and in scheduled group therapies   Safety and Monitoring:  Voluntary admission to inpatient psychiatric unit for safety, stabilization and treatment  Daily contact with patient to assess and evaluate symptoms and progress in treatment  Patient's case to be discussed in multi-disciplinary team meeting  Observation Level : q15 minute checks  Vital signs: q12 hours  Precautions: suicide, but pt currently verbally contracts for safety on unit?   Discharge Planning:  Social work and case management to assist with discharge planning and identification of hospital follow-up needs prior to discharge  Estimated LOS: 5-7?days  Discharge Concerns: Need to establish Safety plan; Medication compliance and effectiveness  Discharge Goals: Return home with outpatient referrals for mental health follow-up including medication management/psychotherapy.   Long Term Goal(s): Improvement in symptoms so as ready for discharge  Short Term Goals: Ability to identify changes in lifestyle to reduce recurrence of condition will improve, Ability to verbalize feelings will improve, Ability to disclose and discuss suicidal ideas, Ability to demonstrate self-control will improve, Ability to identify and develop effective coping behaviors will improve, Ability to maintain clinical measurements within normal limits will improve, Compliance with prescribed medications will improve, and Ability to identify triggers associated with substance abuse/mental health issues will improve  Physician Treatment Plan for Secondary Diagnosis: Principal Problem:   MDD (major depressive disorder)   Prolonged grief   ADHD  I certify that inpatient services furnished can reasonably be expected to improve  the patient's condition.   Ellouise JAYSON Azure, FNP 12/23/2023, 9:16 AM

## 2023-12-23 NOTE — Group Note (Signed)
 Date:  12/23/2023 Time:  11:25 AM  Group Topic/Focus:  Goals Group:   The focus of this group is to help patients establish daily goals to achieve during treatment and discuss how the patient can incorporate goal setting into their daily lives to aide in recovery.    Participation Level:  Active  Participation Quality:  Attentive  Affect:  Appropriate  Cognitive:  Appropriate  Insight: Appropriate  Engagement in Group:  Engaged  Modes of Intervention:  Education  Additional Comments:    Avelina DELENA Humphreys 12/23/2023, 11:25 AM
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
## 2023-12-23 NOTE — BH IP Treatment Plan (Signed)
 Interdisciplinary Treatment and Diagnostic Plan Update  12/23/2023 Time of Session: 1030AM Shane Walker MRN: [id number]  Principal Diagnosis: MDD (major depressive disorder)  Secondary Diagnoses: Principal Problem:   MDD (major depressive disorder)   Current Medications:  Current Facility-Administered Medications  Medication Dose Route Frequency Provider Last Rate Last Admin   acetaminophen (TYLENOL) tablet 650 mg  650 mg Oral Q6H PRN Motley-Mangrum, Jadeka A, PMHNP       alum & mag hydroxide-simeth (MAALOX/MYLANTA) 200-200-20 MG/5ML suspension 30 mL  30 mL Oral Q4H PRN Motley-Mangrum, Jadeka A, PMHNP       haloperidol (HALDOL) tablet 5 mg  5 mg Oral TID PRN Motley-Mangrum, Jadeka A, PMHNP       And   diphenhydrAMINE (BENADRYL) capsule 50 mg  50 mg Oral TID PRN Motley-Mangrum, Jadeka A, PMHNP       haloperidol lactate (HALDOL) injection 5 mg  5 mg Intramuscular TID PRN Motley-Mangrum, Jadeka A, PMHNP       And   diphenhydrAMINE (BENADRYL) injection 50 mg  50 mg Intramuscular TID PRN Motley-Mangrum, Jadeka A, PMHNP       And   LORazepam (ATIVAN) injection 2 mg  2 mg Intramuscular TID PRN Motley-Mangrum, Jadeka A, PMHNP       haloperidol lactate (HALDOL) injection 10 mg  10 mg Intramuscular TID PRN Motley-Mangrum, Jadeka A, PMHNP       And   diphenhydrAMINE (BENADRYL) injection 50 mg  50 mg Intramuscular TID PRN Motley-Mangrum, Jadeka A, PMHNP       And   LORazepam (ATIVAN) injection 2 mg  2 mg Intramuscular TID PRN Motley-Mangrum, Jadeka A, PMHNP       FLUoxetine  (PROZAC ) capsule 20 mg  20 mg Oral Daily Motley-Mangrum, Jadeka A, PMHNP   20 mg at 12/23/23 1039   hydrOXYzine (ATARAX) tablet 25 mg  25 mg Oral TID PRN Onuoha, Chinwendu V, NP   25 mg at 12/23/23 0248   magnesium  hydroxide (MILK OF MAGNESIA) suspension 30 mL  30 mL Oral Daily PRN Motley-Mangrum, Jadeka A, PMHNP       PTA Medications: No medications prior to admission.    Patient Stressors: Health problems   Loss of  Parent (Mother)    Patient Strengths: Ability for Presenter, broadcasting  Motivation for treatment/growth   Treatment Modalities: Medication Management, Group therapy, Case management,  1 to 1 session with clinician, Psychoeducation, Recreational therapy.   Physician Treatment Plan for Primary Diagnosis: MDD (major depressive disorder) Long Term Goal(s): Improvement in symptoms so as ready for discharge   Short Term Goals: Ability to identify changes in lifestyle to reduce recurrence of condition will improve Ability to verbalize feelings will improve Ability to disclose and discuss suicidal ideas Ability to demonstrate self-control will improve Ability to identify and develop effective coping behaviors will improve Ability to maintain clinical measurements within normal limits will improve Compliance with prescribed medications will improve Ability to identify triggers associated with substance abuse/mental health issues will improve  Medication Management: Evaluate patient's response, side effects, and tolerance of medication regimen.  Therapeutic Interventions: 1 to 1 sessions, Unit Group sessions and Medication administration.  Evaluation of Outcomes: Not Progressing  Physician Treatment Plan for Secondary Diagnosis: Principal Problem:   MDD (major depressive disorder)  Long Term Goal(s): Improvement in symptoms so as ready for discharge   Short Term Goals: Ability to identify changes in lifestyle to reduce recurrence of condition will improve Ability to verbalize feelings will improve Ability to disclose and discuss  suicidal ideas Ability to demonstrate self-control will improve Ability to identify and develop effective coping behaviors will improve Ability to maintain clinical measurements within normal limits will improve Compliance with prescribed medications will improve Ability to identify triggers associated with substance abuse/mental health issues will  improve     Medication Management: Evaluate patient's response, side effects, and tolerance of medication regimen.  Therapeutic Interventions: 1 to 1 sessions, Unit Group sessions and Medication administration.  Evaluation of Outcomes: Not Progressing   RN Treatment Plan for Primary Diagnosis: MDD (major depressive disorder) Long Term Goal(s): Knowledge of disease and therapeutic regimen to maintain health will improve  Short Term Goals: Ability to remain free from injury will improve, Ability to verbalize frustration and anger appropriately will improve, Ability to demonstrate self-control, Ability to participate in decision making will improve, Ability to verbalize feelings will improve, Ability to disclose and discuss suicidal ideas, Ability to identify and develop effective coping behaviors will improve, and Compliance with prescribed medications will improve  Medication Management: RN will administer medications as ordered by provider, will assess and evaluate patient's response and provide education to patient for prescribed medication. RN will report any adverse and/or side effects to prescribing provider.  Therapeutic Interventions: 1 on 1 counseling sessions, Psychoeducation, Medication administration, Evaluate responses to treatment, Monitor vital signs and CBGs as ordered, Perform/monitor CIWA, COWS, AIMS and Fall Risk screenings as ordered, Perform wound care treatments as ordered.  Evaluation of Outcomes: Not Progressing   LCSW Treatment Plan for Primary Diagnosis: MDD (major depressive disorder) Long Term Goal(s): Safe transition to appropriate next level of care at discharge, Engage patient in therapeutic group addressing interpersonal concerns.  Short Term Goals: Engage patient in aftercare planning with referrals and resources, Increase social support, Increase ability to appropriately verbalize feelings, Increase emotional regulation, Facilitate acceptance of mental health  diagnosis and concerns, Facilitate patient progression through stages of change regarding substance use diagnoses and concerns, Identify triggers associated with mental health/substance abuse issues, and Increase skills for wellness and recovery  Therapeutic Interventions: Assess for all discharge needs, 1 to 1 time with Social worker, Explore available resources and support systems, Assess for adequacy in community support network, Educate family and significant other(s) on suicide prevention, Complete Psychosocial Assessment, Interpersonal group therapy.  Evaluation of Outcomes: Not Progressing   Progress in Treatment: Attending groups: Yes. Participating in groups: Yes. Taking medication as prescribed: Yes. Toleration medication: Yes. Family/Significant other contact made: No, will contact:  consents pending Patient understands diagnosis: Yes. Discussing patient identified problems/goals with staff: Yes. Medical problems stabilized or resolved: Yes. Denies suicidal/homicidal ideation: Yes. Issues/concerns per patient self-inventory: No.  New problem(s) identified: No, Describe:  none  New Short Term/Long Term Goal(s): medication stabilization, elimination of SI thoughts, development of comprehensive mental wellness plan.    Patient Goals:  Improve my self esteem  Discharge Plan or Barriers: Patient recently admitted. CSW will continue to follow and assess for appropriate referrals and possible discharge planning.    Reason for Continuation of Hospitalization: Depression Medication stabilization  Estimated Length of Stay: 5-7 days  Last 3 [place] Suicide Severity Risk Score: Flowsheet Row Ad[facility] (Current) [facility] from 12/21/2023 in [facility] Department at [facility] ED from 01/03/2023 in [facility] at [facility]  C-SSRS RISK CATEGORY High Risk High Risk No Risk     Last [facility] 2/9 Scores:     No data to display  Scribe for Treatment Team: Jenkins LULLA Primer, [place] 12/23/2023 11:57 AM

## 2023-12-23 NOTE — Progress Notes (Signed)
 Patient's urine sample placed in lab refrigeration for lab pick up.

## 2023-12-23 NOTE — Group Note (Unsigned)
 Date:  12/23/2023 Time:  10:28 AM  Group Topic/Focus:  Goals Group:   The focus of this group is to help patients establish daily goals to achieve during treatment and discuss how the patient can incorporate goal setting into their daily lives to aide in recovery.     Participation Level:  {BHH PARTICIPATION OZCZO:77735}  Participation Quality:  {BHH PARTICIPATION QUALITY:22265}  Affect:  {BHH AFFECT:22266}  Cognitive:  {BHH COGNITIVE:22267}  Insight: {BHH Insight2:20797}  Engagement in Group:  {BHH ENGAGEMENT IN HMNLE:77731}  Modes of Intervention:  {BHH MODES OF INTERVENTION:22269}  Additional Comments:  ***  Shane Walker 12/23/2023, 10:28 AM

## 2023-12-23 NOTE — Progress Notes (Signed)
 Spirituality group facilitated by Elia Rockie Sofia, BCC.  Group Description: Group focused on topic of hope. Patients participated in facilitated discussion around topic, connecting with one another around experiences and definitions for hope. Group members engaged with visual explorer photos, reflecting on what hope looks like for them today. Group engaged in discussion around how their definitions of hope are present today in hospital.  Modalities: Psycho-social ed, Adlerian, Narrative, MI  Patient Progress: Shane Walker attended group.  Though verbal participation was minimal, he demonstrated engagement in the group conversaiton.

## 2023-12-23 NOTE — Plan of Care (Signed)
 Nurse discussed anxiety, depression and coping skills with patient.

## 2023-12-24 NOTE — Group Note (Signed)
 Date:  12/24/2023 Time:  9:51 AM  Group Topic/Focus:  Goals Group:   The focus of this group is to help patients establish daily goals to achieve during treatment and discuss how the patient can incorporate goal setting into their daily lives to aide in recovery.    Participation Level:  Did Not Attend   Shane Walker 12/24/2023, 9:51 AM

## 2023-12-24 NOTE — Group Note (Signed)
 LCSW Group Therapy Note   Group Date: 12/24/2023 Start Time: 1100 End Time: 1200   Participation:  patient was present.  He listened and was respectful but didn't participate in the discussion.  Type of Therapy:  Group Therapy  Topic:  Speaking from the Heart:  Communicating with Understanding and Empathy  Objective:  To help participants develop effective communication skills to express themselves clearly, listen actively, and navigate conflicts in a healthy way.  Goals: Increase awareness of verbal and non-verbal communication skills. Practice using "I" statements and active listening techniques. Learn coping strategies for managing communication stress.  Summary:  Participants explored the importance of communication, discussed challenges, and practiced skills such as active listening and assertive expression. They reflected on past experiences and identified ways to improve communication in their daily lives.  Therapeutic Modalities: Cognitive-Behavioral Therapy (CBT): Restructuring negative thought patterns in communication. Mindfulness: Staying present and calm during conversations. Psychoeducation: Learning about effective communication techniques.   Lucian Baswell O Rolonda Pontarelli, LCSWA 12/24/2023  12:48 PM

## 2023-12-24 NOTE — Plan of Care (Signed)

## 2023-12-24 NOTE — Progress Notes (Signed)
 [facility] MD Progress Note  12/24/2023 8:32 AM Laterrian PEDRO WHITERS  MRN:  [id number]  Principal Problem: MDD (major depressive disorder) Diagnosis: Principal Problem:   MDD (major depressive disorder)  Reason for admission:  This is patient's first inpatient psychiatric admission to [facility]  Lynton MATEI MAGNONE is a 23 year old Caucasian male with self-reported history of ADHD, prolonged grief, and major depressive disorder, who presents voluntarily to [facility] from [facility] health ED at [facility] for worsening depression resulting in suicidal attempts by overdosing on 18 to 20 capsules of Adderall prescribed for his deceased mother in the context of psychosocial stressors. UDS positive for amphetamine, possibly due to Adderall, and BAL less than 15.   24-hour chart review: Patient case discussed in the interdisciplinary team meeting.  Vital signs reviewed without critical values.  PRNs of hydroxyzine x 2 for anxiety, and trazodone x 1 for insomnia required.  No agitation protocol required.  Today's assessment notes: On assessment today, the pt reports that his mood is improving and less depressed compared to admission.  He presents alert, calm, cooperative, and oriented to person, time, place, and situation.  Hygiene is commendable with hair well-kept.  Speech is clear coherent with normal volume/pattern and slight stuttering.  Chart reviewed and findings shared with the treatment team and consult with attending psychiatrist with recommendation to continue current treatment plan.  Patient reports sleeping well and being drowsy in the morning possibly due to new trazodone that was initiated last night for insomnia.  Made patient aware that if drowsiness continue, may need to reduce the dose to 50 mg to 25 mg.  Patient is in agreement.  Observe attending and participating effectively in unit group activities and therapeutic milieu.  Denies delusional thinking or paranoia.  Further  denies SI, HI, or AVH.  Will continue to monitor patient every 15 minutes for safety Reports that anxiety is at a manageable level Sleep is stable Appetite is good Concentration is much improved Energy level is adequate   Denies having side effects to current psychiatric medications.   Total Time spent with patient: 45 minutes  Past Psychiatric History: Previous Psych Diagnoses: MDD, ADHD, prolonged grief Prior inpatient treatment: Denies Current/prior outpatient treatment: Denies Prior rehab hx: Denies Psychotherapy hx: Denies History of suicide: Denies History of homicide or aggression: Denies Psychiatric medication history: None Psychiatric medication compliance history: Never been on psychotropic medications Neuromodulation history: Denies Current Psychiatrist: Denies Current therapist: Denies  Past Medical History: History reviewed. No pertinent past medical history.  Past Surgical History:  Procedure Laterality Date   ASD REPAIR     HERNIA REPAIR     HYDROCELE EXCISION / REPAIR     Family History:  Family History  Problem Relation Age of Onset   Migraines Mother    ADD / ADHD Mother    Anxiety disorder Mother    Depression Mother    Seizures Maternal Uncle    ADD / ADHD Maternal Uncle    Autism Neg Hx    Bipolar disorder Neg Hx    Schizophrenia Neg Hx    Family Psychiatric  History: See H&P Social History:  Social History   Substance and Sexual Activity  Alcohol Use No     Social History   Substance and Sexual Activity  Drug Use No    Social History   Socioeconomic History   Marital status: Single    Spouse name: Not on file   Number of children: Not on file  Years of education: Not on file   Highest education level: Not on file  Occupational History   Not on file  Tobacco Use   Smoking status: Never   Smokeless tobacco: Never  Substance and Sexual Activity   Alcohol use: No   Drug use: No   Sexual activity: Never  Other Topics Concern    Not on file  Social History Narrative   Lives with mom, her partner and sibling. He is in the 11th grade at [employer]   Social Drivers of Health   Financial Resource Strain: Not on file  Food Insecurity: No Food Insecurity (12/22/2023)   Hunger Vital Sign    Worried About Running Out of Food in the Last Year: Never true    Ran Out of Food in the Last Year: Never true  Transportation Needs: No Transportation Needs (12/22/2023)   PRAPARE - Administrator, Civil Service (Medical): No    Lack of Transportation (Non-Medical): No  Physical Activity: Not on file  Stress: Not on file  Social Connections: Not on file   Additional Social History:    Sleep: Good Estimated Sleeping Duration (Last 24 Hours): 5.25-6.25 hours  Appetite:  Good  Current Medications: Current Facility-Administered Medications  Medication Dose Route Frequency Provider Last Rate Last Admin   acetaminophen (TYLENOL) tablet 650 mg  650 mg Oral Q6H PRN Motley-Mangrum, Jadeka A, PMHNP       alum & mag hydroxide-simeth (MAALOX/MYLANTA) 200-200-20 MG/5ML suspension 30 mL  30 mL Oral Q4H PRN Motley-Mangrum, Jadeka A, PMHNP       haloperidol (HALDOL) tablet 5 mg  5 mg Oral TID PRN Motley-Mangrum, Jadeka A, PMHNP       And   diphenhydrAMINE (BENADRYL) capsule 50 mg  50 mg Oral TID PRN Motley-Mangrum, Jadeka A, PMHNP       haloperidol lactate (HALDOL) injection 5 mg  5 mg Intramuscular TID PRN Motley-Mangrum, Jadeka A, PMHNP       And   diphenhydrAMINE (BENADRYL) injection 50 mg  50 mg Intramuscular TID PRN Motley-Mangrum, Jadeka A, PMHNP       And   LORazepam (ATIVAN) injection 2 mg  2 mg Intramuscular TID PRN Motley-Mangrum, Jadeka A, PMHNP       haloperidol lactate (HALDOL) injection 10 mg  10 mg Intramuscular TID PRN Motley-Mangrum, Jadeka A, PMHNP       And   diphenhydrAMINE (BENADRYL) injection 50 mg  50 mg Intramuscular TID PRN Motley-Mangrum, Jadeka A, PMHNP       And   LORazepam (ATIVAN)  injection 2 mg  2 mg Intramuscular TID PRN Motley-Mangrum, Jadeka A, PMHNP       FLUoxetine  (PROZAC ) capsule 20 mg  20 mg Oral Daily Motley-Mangrum, Jadeka A, PMHNP   20 mg at 12/24/23 0817   hydrOXYzine (ATARAX) tablet 25 mg  25 mg Oral TID PRN Onuoha, Chinwendu V, NP   25 mg at 12/23/23 2151   magnesium  hydroxide (MILK OF MAGNESIA) suspension 30 mL  30 mL Oral Daily PRN Motley-Mangrum, Jadeka A, PMHNP       traZODone (DESYREL) tablet 50 mg  50 mg Oral QHS PRN Dewanna Hurston C, FNP   50 mg at 12/23/23 2151   Lab Results:  Results for orders placed or performed during the hospital encounter of 12/22/23 (from the past 48 hours)  Urinalysis, Complete w Microscopic -Urine, Clean Catch     Status: None   Collection Time: 12/23/23  5:14 PM  Result Value  Ref Range   Color, Urine YELLOW YELLOW   APPearance CLEAR CLEAR   Specific Gravity, Urine 1.023 1.005 - 1.030   pH 5.0 5.0 - 8.0   Glucose, UA NEGATIVE NEGATIVE mg/dL   Hgb urine dipstick NEGATIVE NEGATIVE   Bilirubin Urine NEGATIVE NEGATIVE   Ketones, ur NEGATIVE NEGATIVE mg/dL   Protein, ur NEGATIVE NEGATIVE mg/dL   Nitrite NEGATIVE NEGATIVE   Leukocytes,Ua NEGATIVE NEGATIVE   RBC / HPF 0-5 0 - 5 RBC/hpf   WBC, UA 0-5 0 - 5 WBC/hpf   Bacteria, UA NONE SEEN NONE SEEN   Squamous Epithelial / HPF 0-5 0 - 5 /HPF   Mucus PRESENT     Comment: Performed at [facility], [address] [place], [place] [address]  TSH     Status: None   Collection Time: 12/23/23  6:10 PM  Result Value Ref Range   TSH 1.330 0.350 - 4.500 uIU/mL    Comment: Performed at [facility], [address] [place], [place] [address]  VITAMIN D 25 Hydroxy (Vit-D Deficiency, Fractures)     Status: Abnormal   Collection Time: 12/23/23  6:10 PM  Result Value Ref Range   Vit D, 25-Hydroxy 18.89 (L) 30 - 100 ng/mL    Comment: (NOTE) Vitamin D deficiency has been defined by the Institute of Medicine  and an Endocrine Society practice  guideline as a level of serum 25-OH  vitamin D less than 20 ng/mL (1,2). The Endocrine Society went on to  further define vitamin D insufficiency as a level between 21 and 29  ng/mL (2).  1. IOM (Institute of Medicine). 2010. Dietary reference intakes for  calcium and D. [place]: [employer]. 2. Holick MF, [place], Bischoff-Ferrari HA, et al. Evaluation,  treatment, and prevention of vitamin D deficiency: an Endocrine  Society clinical practice guideline, JCEM. 2011 Jul; 96(7): 1911-30.  Performed at [facility], [address], [place], [place] [address]   Hemoglobin A1c     Status: Abnormal   Collection Time: 12/23/23  6:10 PM  Result Value Ref Range   Hgb A1c MFr Bld 4.6 (L) 4.8 - 5.6 %    Comment: (NOTE) Diagnosis of Diabetes The following HbA1c ranges recommended by the American Diabetes Association (ADA) may be used as an aid in the diagnosis of diabetes mellitus.  Hemoglobin             Suggested A1C NGSP%              Diagnosis  <5.7                   Non Diabetic  5.7-6.4                Pre-Diabetic  >6.4                   Diabetic  <7.0                   Glycemic control for                       adults with diabetes.     Mean Plasma Glucose 85.32 mg/dL    Comment: Performed at [facility], [address] [place], [place] [address]   Blood Alcohol level:  Lab Results  Component Value Date   [facility] <15 12/21/2023   Metabolic Disorder Labs: Lab Results  Component  Value Date   HGBA1C 4.6 (L) 12/23/2023   MPG 85.32 12/23/2023   No results found for: PROLACTIN No results found for: CHOL, TRIG, HDL, CHOLHDL, VLDL, LDLCALC  Physical Findings: AIMS:  ,  ,  ,  ,  ,  ,   CIWA:    COWS:     Musculoskeletal: Strength & Muscle Tone: within normal limits Gait & Station: normal Patient leans: N/A  Psychiatric Specialty Exam:  Presentation  General Appearance:  Appropriate for Environment; Casual  Eye  Contact: Good  Speech: Clear and Coherent (With mild stuttering)  Speech Volume: Normal  Handedness: Right  Mood and Affect  Mood: Anxious; Depressed  Affect: Congruent  Thought Process  Thought Processes: Coherent; Goal Directed  Descriptions of Associations:Intact  Orientation:Full (Time, Place and Person)  Thought Content:Logical; WDL  History of Schizophrenia/Schizoaffective disorder:No data recorded Duration of Psychotic Symptoms:No data recorded Hallucinations:Hallucinations: None  Ideas of Reference:None  Suicidal Thoughts:Suicidal Thoughts: No  Homicidal Thoughts:Homicidal Thoughts: No  Sensorium  Memory: Immediate Fair; Recent Fair  Judgment: Fair  Insight: Fair  Art therapist  Concentration: Fair  Attention Span: Fair  Recall: [employer] of Knowledge: Fair  Language: Fair  Psychomotor Activity  Psychomotor Activity: Psychomotor Activity: Normal  Assets  Assets: Communication Skills; Desire for Improvement; Physical Health; Resilience  Sleep  Sleep: Sleep: Good Number of Hours of Sleep: 6  Physical Exam: Physical Exam Vitals and nursing note reviewed.  Constitutional:      General: He is not in acute distress.    Appearance: He is normal weight. He is not ill-appearing.  HENT:     Head: Normocephalic.     Right Ear: External ear normal.     Nose: Nose normal.     Mouth/Throat:     Mouth: Mucous membranes are moist.     Pharynx: Oropharynx is clear.  Eyes:     Extraocular Movements: Extraocular movements intact.  Cardiovascular:     Rate and Rhythm: Normal rate.     Pulses: Normal pulses.  Pulmonary:     Effort: Pulmonary effort is normal. No respiratory distress.  Abdominal:     Comments: Deferred  Genitourinary:    Comments: Deferred Musculoskeletal:        General: Normal range of motion.     Cervical back: Normal range of motion.  Skin:    General: Skin is dry.  Neurological:     General: No  focal deficit present.     Mental Status: He is alert and oriented to person, place, and time.  Psychiatric:        Mood and Affect: Mood normal.        Behavior: Behavior normal.    Review of Systems  Constitutional:  Negative for chills and fever.  HENT:  Negative for sore throat.   Eyes:  Negative for blurred vision.  Respiratory:  Negative for cough, sputum production, shortness of breath and wheezing.   Cardiovascular:  Negative for chest pain and palpitations.  Gastrointestinal:  Negative for abdominal pain, constipation, diarrhea, heartburn, nausea and vomiting.  Genitourinary:  Negative for dysuria.  Musculoskeletal:  Negative for falls.  Skin:  Negative for itching and rash.  Neurological:  Negative for dizziness and headaches.  Endo/Heme/Allergies:        See allergy listing  Psychiatric/Behavioral:  Positive for depression. Negative for hallucinations, substance abuse and suicidal ideas. The patient is nervous/anxious. The patient does not have insomnia.    Blood pressure 127/80, pulse 73, temperature 97.6  F (36.4 C), temperature source Oral, resp. rate 20, height 5' 7 (1.702 m), weight 60.8 kg, SpO2 100%. Body mass index is 20.99 kg/m.  Treatment Plan Summary: Daily contact with patient to assess and evaluate symptoms and progress in treatment and Medication management Treatment Plan Summary: Daily contact with patient to assess and evaluate symptoms and progress in treatment and Medication management   Observation Level/Precautions:  15 minute checks  Laboratory:   CBC with differential: WBC 13.3, RBC 6.10, HCT 52.5, otherwise normal Chemistry Profile: Glucose 121, otherwise normal HbAIC: Ordered HCG: Not applicable UDS: Positive for amphetamine, possibly due to overdose on Adderall UA: Ordered Vitamin D 25-hydroxy: Ordered TSH: Ordered   EKG: Sinus tachycardia, rate 110, QT/QTc 303/410  Psychotherapy: Therapeutic milieu  Medications: See MAR   Consultations: Pending  Discharge Concerns: Safety  Estimated LOS: 3 to 7 days  Other:      Assessment: This is patient's first inpatient psychiatric admission to [facility].  Rhett TAVARES LEVINSON is a 23 year old Caucasian male with self-reported history of ADHD, prolonged grief, and major depressive disorder, who presents voluntarily to [facility] from [facility] health ED at [facility] for worsening depression resulting in suicidal attempts by overdosing on 18 to 20 capsules of Adderall prescribed for his ADHD in the context of psychosocial stressors.  UDS positive for amphetamine, possibly due to Adderall, and BAL less than 15.   Physician Treatment Plan for Primary Diagnosis: MDD (major depressive disorder)   Plans: Medications: --Continue Prozac  capsule 20 mg p.o. daily for depression and anxiety.  May titrate capsule as needed depending on patient's symptoms --Continue Hydroxyzine tablet 25 mg p.o. 3 times daily as needed for anxiety --Continue Trazodone 50 mg p.o. q. nightly as needed for sleep   Medication for other medical: None   Continue BH Agitation Protocol    Other PRN Medications  -Acetaminophen 650 mg every 6 as needed/mild pain  -Maalox 30 mL oral every 4 as needed/digestion  -Magnesium  hydroxide 30 mL daily as needed/mild constipation    --The risks/benefits/side-effects/alternatives to this medication were discussed in detail with the patient and time was given for questions.  The patient consents to medication trial.   -- Metabolic profile and EKG monitoring obtained while on an atypical antipsychotic (BMI: Lipid Panel: HbgA1c: QTc:)   -- Encouraged patient to participate in unit milieu and in scheduled group therapies    Safety and Monitoring:  Voluntary admission to inpatient psychiatric unit for safety, stabilization and treatment  Daily contact with patient to assess and evaluate symptoms and progress in treatment  Patient's case to be  discussed in multi-disciplinary team meeting  Observation Level : q15 minute checks  Vital signs: q12 hours  Precautions: suicide, but pt currently verbally contracts for safety on unit?    Discharge Planning:  Social work and case management to assist with discharge planning and identification of hospital follow-up needs prior to discharge  Estimated LOS: 5-7?days  Discharge Concerns: Need to establish Safety plan; Medication compliance and effectiveness  Discharge Goals: Return home with outpatient referrals for mental health follow-up including medication management/psychotherapy.    Long Term Goal(s): Improvement in symptoms so as ready for discharge   Short Term Goals: Ability to identify changes in lifestyle to reduce recurrence of condition will improve, Ability to verbalize feelings will improve, Ability to disclose and discuss suicidal ideas, Ability to demonstrate self-control will improve, Ability to identify and develop effective coping behaviors will improve, Ability to maintain clinical measurements  within normal limits will improve, Compliance with prescribed medications will improve, and Ability to identify triggers associated with substance abuse/mental health issues will improve   Physician Treatment Plan for Secondary Diagnosis: Principal Problem:   MDD (major depressive disorder)   Prolonged grief   ADHD   I certify that inpatient services furnished can reasonably be expected to improve the patient's condition.     Ellouise JAYSON Azure, FNP 12/24/2023, 8:32 AM

## 2023-12-24 NOTE — Progress Notes (Signed)
(  Sleep Hours) -6 hours (Any PRNs that were needed, meds refused, or side effects to meds)- trazodone, atarax (Any disturbances and when (visitation, over night) (Concerns raised by the patient)- denies (SI/HI/AVH)- denies

## 2023-12-24 NOTE — Progress Notes (Signed)
 D. Pt presents with a sad affect, depressed mood, calm, cooperative behavior. Pt reported sleeping well last night, described his appetite and concentration as 'good', and energy level as 'normal'. Per pt's self inventory, pt rated his depression,hopelessness and anxiety a 3/1/2, respectively. Pt's stated goal today was to work on leaving.  Pt currently denies SI/HI and AVH and doesn't appear to be responding to internal stimuli..  A. Labs and vitals monitored. Pt given and educated on medication. Pt supported emotionally and encouraged to express concerns and ask questions.   R. Pt remains safe with 15 minute checks. Will continue POC.

## 2023-12-24 NOTE — Group Note (Signed)
 Date:  12/24/2023 Time:  2:57 PM  Group Topic/Focus:  Emotional Education: The session centered around the Circle of Control activity, which helps participants identify what aspects of their life they can control versus what is outside their control. This exercise encourages emotional awareness, personal empowerment, and stress management. Participants were guided to reflect on situations in their life and categorize them into three areas: Things I can control, Things I can influence, and Things I cannot control. The group was encouraged to reflect on the impact these distinctions have on their emotional well-being.  Participation Level:  Active  Participation Quality:  Appropriate  Affect:  Appropriate  Cognitive:  Appropriate  Insight: Appropriate  Engagement in Group:  Engaged  Modes of Intervention:  Activity and Discussion  Additional Comments:    Dayelin Balducci R Deja Kaigler 12/24/2023, 2:57 PM
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
## 2023-12-24 NOTE — Plan of Care (Signed)
   Problem: Education: Goal: Knowledge of [place] Education information/materials will improve Outcome: Progressing Goal: Verbalization of understanding the information provided will improve Outcome: Progressing

## 2023-12-24 NOTE — Group Note (Unsigned)
 Date:  12/25/2023 Time:  1:51 AM  Group Topic/Focus:  Wrap-Up Group:   The focus of this group is to help patients review their daily goal of treatment and discuss progress on daily workbooks.    Participation Level:  Active  Participation Quality:  Appropriate and Attentive  Affect:  Appropriate  Cognitive:  Alert and Appropriate  Insight: Appropriate and Good  Engagement in Group:  Engaged  Modes of Intervention:  Discussion and Education  Additional Comments:  Pt attended and participated in wrap up group this evening and rated their day a 9.1/10. Pt stated that they have interacted with their peers and visited with their cousin which had a positive affect of their day. Pt goal is to make an important phone call to both their best friend and a girl that they are in love with. Pt had no goals that they were working on today but they plan to make those needed phone calls soon. Pt has no concerns to relay at this time.   Shane Walker 12/25/2023, 1:51 AM

## 2023-12-24 NOTE — Group Note (Signed)
 Date:  12/24/2023 Time:  4:28 PM  Group Topic/Focus:  Overcoming Stress:   The focus of this group is to define stress and help patients assess their triggers.    Participation Level:  Active  Participation Quality:  Appropriate  Affect:  Appropriate  Cognitive:  Appropriate  Insight: Appropriate  Engagement in Group:  Engaged  Modes of Intervention:  Discussion   Annalee  Danyel Griess 12/24/2023, 4:28 PM

## 2023-12-25 DIAGNOSIS — F329 Major depressive disorder, single episode, unspecified: Secondary | ICD-10-CM

## 2023-12-25 DIAGNOSIS — F4321 Adjustment disorder with depressed mood: Secondary | ICD-10-CM

## 2023-12-25 DIAGNOSIS — F909 Attention-deficit hyperactivity disorder, unspecified type: Secondary | ICD-10-CM

## 2023-12-25 NOTE — Group Note (Signed)
 Recreation Therapy Group Note   Group Topic:Team Building  Group Date: 12/25/2023 Start Time: 0935 End Time: 1013 Facilitators: Kyrianna Barletta-McCall, LRT,CTRS Location: [address] Dayroom   Group Topic: Communication, Team Building, Problem Solving  Goal Area(s) Addresses:  Patient will effectively work with peer towards shared goal.  Patient will identify skills used to make activity successful.  Patient will identify how skills used during activity can be used to reach post d/c goals.   Behavioral Response: Moderate  Intervention: STEM Activity  Activity: Straw Bridge. In teams of 3-5, patients were given 15 plastic drinking straws and an equal length of masking tape. Using the materials provided, patients were instructed to build a free standing bridge-like structure to suspend an everyday item (ex: puzzle box) off of the floor or table surface. All materials were required to be used by the team in their design. LRT facilitated post-activity discussion reviewing team process. Patients were encouraged to reflect how the skills used in this activity can be generalized to daily life post discharge.   Education: Pharmacist, community, Scientist, physiological, Discharge Planning   Education Outcome: Acknowledges education/In group clarification offered/Needs additional education.    Affect/Mood: Appropriate   Participation Level: Moderate   Participation Quality: Independent   Behavior: Attentive    Speech/Thought Process: Relevant   Insight: Good   Judgement: Good   Modes of Intervention: STEM Activity   Patient Response to Interventions:  Attentive   Education Outcome:  In group clarification offered    Clinical Observations/Individualized Feedback: Pt was quiet but attentive. Pt would assist when needed. However, spent more time observing. Pt was appropriate throughout group.      Plan: Continue to engage patient in RT group sessions 2-3x/week.   Shane Walker,  LRT,CTRS 12/25/2023 12:01 PM

## 2023-12-25 NOTE — Progress Notes (Signed)
 [facility] MD Progress Note  12/25/2023 12:11 PM Shane Walker  MRN:  [id number]  Principal Problem: MDD (major depressive disorder) Diagnosis: Principal Problem:   MDD (major depressive disorder)   Prolonged grief   ADHD  Reason for admission:  This is patient's first inpatient psychiatric admission to [facility].  Shane Walker is a 23 year old Caucasian male with self-reported history of ADHD, prolonged grief, and major depressive disorder, who presents voluntarily to [facility] from [facility] health ED at [facility] for worsening depression resulting in suicidal attempts by overdosing on 18 to 20 capsules of Adderall prescribed for his deceased mother in the context of psychosocial stressors. UDS positive for amphetamine, possibly due to Adderall, and BAL less than 15.   24-hour chart review: Patient case discussed in the interdisciplinary team meeting.  Vital signs reviewed without critical values.  No PRNs required.  No agitation protocol required.  Today's assessment notes: Patient is seen and examined in the assessment room sitting up in a chair. Shane Walker reports mood is euthymic and anxiety is minimal.  He presents alert, pleasant, cooperative and oriented to person, place, time, and situation. Chart reviewed and findings shared with the treatment team and consult with attending psychiatrist with plans to continue current treatment plan as already in progress.  Speech is clear with normal volume and pattern with slight stuttering.  Observed patient attending and participating in therapeutic milieu and unit group activities.  When asked what he is learning in milieu reports, I know there are stuff  that I cannot control and I should not worry too much about it.  I also learned not to overthink, however, to process my thoughts by stepping back taking deep breathing and thinking through.  Patient insight and judgment seems to be improved.  He reported compliance with his  psychotropic medication without any side effects.  Reports sleep is stable, appetite is improving, and concentration is better.  Reports adequate energy.  He denies delusional thinking or paranoia.  He further denies SI, HI, or AVH.  No changes in his treatment plan today.  If patient's symptoms continue to improve, estimated date of discharge could be this weekend.  Denies having side effects to current psychiatric medications.   Total Time spent with patient: 45 minutes  Past Psychiatric History: Previous Psych Diagnoses: MDD, ADHD, prolonged grief Prior inpatient treatment: Denies Current/prior outpatient treatment: Denies Prior rehab hx: Denies Psychotherapy hx: Denies History of suicide: Denies History of homicide or aggression: Denies Psychiatric medication history: None Psychiatric medication compliance history: Never been on psychotropic medications Neuromodulation history: Denies Current Psychiatrist: Denies Current therapist: Denies  Past Medical History: History reviewed. No pertinent past medical history.  Past Surgical History:  Procedure Laterality Date   ASD REPAIR     HERNIA REPAIR     HYDROCELE EXCISION / REPAIR     Family History:  Family History  Problem Relation Age of Onset   Migraines Mother    ADD / ADHD Mother    Anxiety disorder Mother    Depression Mother    Seizures Maternal Uncle    ADD / ADHD Maternal Uncle    Autism Neg Hx    Bipolar disorder Neg Hx    Schizophrenia Neg Hx    Family Psychiatric  History: See H&P Social History:  Social History   Substance and Sexual Activity  Alcohol Use No     Social History   Substance and Sexual Activity  Drug Use No  Social History   Socioeconomic History   Marital status: Single    Spouse name: Not on file   Number of children: Not on file   Years of education: Not on file   Highest education level: Not on file  Occupational History   Not on file  Tobacco Use   Smoking status: Never    Smokeless tobacco: Never  Substance and Sexual Activity   Alcohol use: No   Drug use: No   Sexual activity: Never  Other Topics Concern   Not on file  Social History Narrative   Lives with mom, her partner and sibling. He is in the 11th grade at [employer]   Social Drivers of Health   Financial Resource Strain: Not on file  Food Insecurity: No Food Insecurity (12/22/2023)   Hunger Vital Sign    Worried About Running Out of Food in the Last Year: Never true    Ran Out of Food in the Last Year: Never true  Transportation Needs: No Transportation Needs (12/22/2023)   PRAPARE - Administrator, Civil Service (Medical): No    Lack of Transportation (Non-Medical): No  Physical Activity: Not on file  Stress: Not on file  Social Connections: Not on file   Additional Social History:    Sleep: Good Estimated Sleeping Duration (Last 24 Hours): 6.75-7.50 hours  Appetite:  Good  Current Medications: Current Facility-Administered Medications  Medication Dose Route Frequency Provider Last Rate Last Admin   acetaminophen (TYLENOL) tablet 650 mg  650 mg Oral Q6H PRN Motley-Mangrum, Jadeka A, PMHNP       alum & mag hydroxide-simeth (MAALOX/MYLANTA) 200-200-20 MG/5ML suspension 30 mL  30 mL Oral Q4H PRN Motley-Mangrum, Jadeka A, PMHNP       haloperidol (HALDOL) tablet 5 mg  5 mg Oral TID PRN Motley-Mangrum, Jadeka A, PMHNP       And   diphenhydrAMINE (BENADRYL) capsule 50 mg  50 mg Oral TID PRN Motley-Mangrum, Jadeka A, PMHNP       haloperidol lactate (HALDOL) injection 5 mg  5 mg Intramuscular TID PRN Motley-Mangrum, Jadeka A, PMHNP       And   diphenhydrAMINE (BENADRYL) injection 50 mg  50 mg Intramuscular TID PRN Motley-Mangrum, Jadeka A, PMHNP       And   LORazepam (ATIVAN) injection 2 mg  2 mg Intramuscular TID PRN Motley-Mangrum, Jadeka A, PMHNP       haloperidol lactate (HALDOL) injection 10 mg  10 mg Intramuscular TID PRN Motley-Mangrum, Jadeka A, PMHNP        And   diphenhydrAMINE (BENADRYL) injection 50 mg  50 mg Intramuscular TID PRN Motley-Mangrum, Jadeka A, PMHNP       And   LORazepam (ATIVAN) injection 2 mg  2 mg Intramuscular TID PRN Motley-Mangrum, Jadeka A, PMHNP       FLUoxetine  (PROZAC ) capsule 20 mg  20 mg Oral Daily Motley-Mangrum, Jadeka A, PMHNP   20 mg at 12/25/23 0829   hydrOXYzine (ATARAX) tablet 25 mg  25 mg Oral TID PRN Onuoha, Chinwendu V, NP   25 mg at 12/23/23 2151   magnesium  hydroxide (MILK OF MAGNESIA) suspension 30 mL  30 mL Oral Daily PRN Motley-Mangrum, Jadeka A, PMHNP       traZODone (DESYREL) tablet 50 mg  50 mg Oral QHS PRN Zasha Belleau C, FNP   50 mg at 12/23/23 2151   Lab Results:  Results for orders placed or performed during the hospital encounter of 12/22/23 (from  the past 48 hours)  Urinalysis, Complete w Microscopic -Urine, Clean Catch     Status: None   Collection Time: 12/23/23  5:14 PM  Result Value Ref Range   Color, Urine YELLOW YELLOW   APPearance CLEAR CLEAR   Specific Gravity, Urine 1.023 1.005 - 1.030   pH 5.0 5.0 - 8.0   Glucose, UA NEGATIVE NEGATIVE mg/dL   Hgb urine dipstick NEGATIVE NEGATIVE   Bilirubin Urine NEGATIVE NEGATIVE   Ketones, ur NEGATIVE NEGATIVE mg/dL   Protein, ur NEGATIVE NEGATIVE mg/dL   Nitrite NEGATIVE NEGATIVE   Leukocytes,Ua NEGATIVE NEGATIVE   RBC / HPF 0-5 0 - 5 RBC/hpf   WBC, UA 0-5 0 - 5 WBC/hpf   Bacteria, UA NONE SEEN NONE SEEN   Squamous Epithelial / HPF 0-5 0 - 5 /HPF   Mucus PRESENT     Comment: Performed at [facility], [address] [place], [place] [address]  TSH     Status: None   Collection Time: 12/23/23  6:10 PM  Result Value Ref Range   TSH 1.330 0.350 - 4.500 uIU/mL    Comment: Performed at [facility], [address] [place], [place] [address]  VITAMIN D 25 Hydroxy (Vit-D Deficiency, Fractures)     Status: Abnormal   Collection Time: 12/23/23  6:10 PM  Result Value Ref Range   Vit D, 25-Hydroxy 18.89  (L) 30 - 100 ng/mL    Comment: (NOTE) Vitamin D deficiency has been defined by the Institute of Medicine  and an Endocrine Society practice guideline as a level of serum 25-OH  vitamin D less than 20 ng/mL (1,2). The Endocrine Society went on to  further define vitamin D insufficiency as a level between 21 and 29  ng/mL (2).  1. IOM (Institute of Medicine). 2010. Dietary reference intakes for  calcium and D. [place]: [employer]. 2. Holick MF, [place], Bischoff-Ferrari HA, et al. Evaluation,  treatment, and prevention of vitamin D deficiency: an Endocrine  Society clinical practice guideline, JCEM. 2011 Jul; 96(7): 1911-30.  Performed at [facility], [address] [place], [place] [address]   Hemoglobin A1c     Status: Abnormal   Collection Time: 12/23/23  6:10 PM  Result Value Ref Range   Hgb A1c MFr Bld 4.6 (L) 4.8 - 5.6 %    Comment: (NOTE) Diagnosis of Diabetes The following HbA1c ranges recommended by the American Diabetes Association (ADA) may be used as an aid in the diagnosis of diabetes mellitus.  Hemoglobin             Suggested A1C NGSP%              Diagnosis  <5.7                   Non Diabetic  5.7-6.4                Pre-Diabetic  >6.4                   Diabetic  <7.0                   Glycemic control for                       adults with diabetes.     Mean Plasma Glucose 85.32 mg/dL    Comment: Performed at [facility], [address],  [place], [place] [address]   Blood Alcohol level:  Lab Results  Component Value Date   [facility] <15 12/21/2023   Metabolic Disorder Labs: Lab Results  Component Value Date   HGBA1C 4.6 (L) 12/23/2023   MPG 85.32 12/23/2023   No results found for: PROLACTIN No results found for: CHOL, TRIG, HDL, CHOLHDL, VLDL, LDLCALC  Physical Findings: AIMS:  ,  ,  ,  ,  ,  ,   CIWA:    COWS:     Musculoskeletal: Strength & Muscle Tone: within normal limits Gait &  Station: normal Patient leans: N/A  Psychiatric Specialty Exam:  Presentation  General Appearance:  Appropriate for Environment; Casual  Eye Contact: Good  Speech: Clear and Coherent  Speech Volume: Normal  Handedness: Right  Mood and Affect  Mood: Anxious; Depressed  Affect: Congruent  Thought Process  Thought Processes: Coherent  Descriptions of Associations:Intact  Orientation:Full (Time, Place and Person)  Thought Content:Logical  History of Schizophrenia/Schizoaffective disorder:No data recorded Duration of Psychotic Symptoms:No data recorded Hallucinations:Hallucinations: None  Ideas of Reference:None  Suicidal Thoughts:Suicidal Thoughts: No  Homicidal Thoughts:Homicidal Thoughts: No  Sensorium  Memory: Immediate Good; Recent Good  Judgment: Fair  Insight: Fair  Art therapist  Concentration: Good  Attention Span: Good  Recall: Fair  Fund of Knowledge: Fair  Language: Good  Psychomotor Activity  Psychomotor Activity: Psychomotor Activity: Normal  Assets  Assets: Communication Skills; Desire for Improvement; Physical Health; Resilience  Sleep  Sleep: Sleep: Good Number of Hours of Sleep: 7.5  Physical Exam: Physical Exam Vitals and nursing note reviewed.  Constitutional:      General: He is not in acute distress.    Appearance: He is normal weight. He is not ill-appearing.  HENT:     Head: Normocephalic.     Right Ear: External ear normal.     Nose: Nose normal.     Mouth/Throat:     Mouth: Mucous membranes are moist.     Pharynx: Oropharynx is clear.  Eyes:     Extraocular Movements: Extraocular movements intact.  Cardiovascular:     Rate and Rhythm: Normal rate.     Pulses: Normal pulses.  Pulmonary:     Effort: Pulmonary effort is normal. No respiratory distress.  Abdominal:     Comments: Deferred  Genitourinary:    Comments: Deferred Musculoskeletal:        General: Normal range of motion.      Cervical back: Normal range of motion.  Skin:    General: Skin is dry.  Neurological:     General: No focal deficit present.     Mental Status: He is alert and oriented to person, place, and time.  Psychiatric:        Mood and Affect: Mood normal.        Behavior: Behavior normal.    Review of Systems  Constitutional:  Negative for chills and fever.  HENT:  Negative for sore throat.   Eyes:  Negative for blurred vision.  Respiratory:  Negative for cough, sputum production, shortness of breath and wheezing.   Cardiovascular:  Negative for chest pain and palpitations.  Gastrointestinal:  Negative for abdominal pain, constipation, diarrhea, heartburn, nausea and vomiting.  Genitourinary:  Negative for dysuria.  Musculoskeletal:  Negative for falls.  Skin:  Negative for itching and rash.  Neurological:  Negative for dizziness and headaches.  Endo/Heme/Allergies:        See allergy listing  Psychiatric/Behavioral:  Positive for depression. Negative for hallucinations, substance abuse  and suicidal ideas. The patient is nervous/anxious. The patient does not have insomnia.    Blood pressure 125/69, pulse 77, temperature 97.8 F (36.6 C), temperature source Oral, resp. rate 20, height 5' 7 (1.702 m), weight 60.8 kg, SpO2 99%. Body mass index is 20.99 kg/m.  Treatment Plan Summary: Daily contact with patient to assess and evaluate symptoms and progress in treatment and Medication management   Observation Level/Precautions:  15 minute checks  Laboratory:   CBC with differential: WBC 13.3, RBC 6.10, HCT 52.5, otherwise normal Chemistry Profile: Glucose 121, otherwise normal HbAIC: Ordered HCG: Not applicable UDS: Positive for amphetamine, possibly due to overdose on Adderall UA: Ordered Vitamin D 25-hydroxy: Ordered TSH: Ordered   EKG: Sinus tachycardia, rate 110, QT/QTc 303/410  Psychotherapy: Therapeutic milieu  Medications: See MAR  Consultations: Pending  Discharge  Concerns: Safety  Estimated LOS: 3 to 7 days  Other:      Assessment: This is patient's first inpatient psychiatric admission to [facility].  Ladarious ALANDO COLLERAN is a 23 year old Caucasian male with self-reported history of ADHD, prolonged grief, and major depressive disorder, who presents voluntarily to [facility] from [facility] health ED at [facility] for worsening depression resulting in suicidal attempts by overdosing on 18 to 20 capsules of Adderall prescribed for his ADHD in the context of psychosocial stressors.  UDS positive for amphetamine, possibly due to Adderall, and BAL less than 15.   Physician Treatment Plan for Primary Diagnosis: MDD (major depressive disorder)   Plans: Medications: --Continue Prozac  capsule 20 mg p.o. daily for depression and anxiety.  May titrate capsule as needed depending on patient's symptoms --Continue Hydroxyzine tablet 25 mg p.o. 3 times daily as needed for anxiety --Continue Trazodone 50 mg p.o. q. nightly as needed for sleep   Medication for other medical: None   Continue BH Agitation Protocol    Other PRN Medications  -Acetaminophen 650 mg every 6 as needed/mild pain  -Maalox 30 mL oral every 4 as needed/digestion  -Magnesium  hydroxide 30 mL daily as needed/mild constipation    --The risks/benefits/side-effects/alternatives to this medication were discussed in detail with the patient and time was given for questions.  The patient consents to medication trial.   -- Metabolic profile and EKG monitoring obtained while on an atypical antipsychotic (BMI: Lipid Panel: HbgA1c: QTc:)   -- Encouraged patient to participate in unit milieu and in scheduled group therapies    Safety and Monitoring:  Voluntary admission to inpatient psychiatric unit for safety, stabilization and treatment  Daily contact with patient to assess and evaluate symptoms and progress in treatment  Patient's case to be discussed in multi-disciplinary team  meeting  Observation Level : q15 minute checks  Vital signs: q12 hours  Precautions: suicide, but pt currently verbally contracts for safety on unit?    Discharge Planning:  Social work and case management to assist with discharge planning and identification of hospital follow-up needs prior to discharge  Estimated LOS: 5-7?days  Discharge Concerns: Need to establish Safety plan; Medication compliance and effectiveness  Discharge Goals: Return home with outpatient referrals for mental health follow-up including medication management/psychotherapy.    Long Term Goal(s): Improvement in symptoms so as ready for discharge   Short Term Goals: Ability to identify changes in lifestyle to reduce recurrence of condition will improve, Ability to verbalize feelings will improve, Ability to disclose and discuss suicidal ideas, Ability to demonstrate self-control will improve, Ability to identify and develop effective coping behaviors will improve, Ability  to maintain clinical measurements within normal limits will improve, Compliance with prescribed medications will improve, and Ability to identify triggers associated with substance abuse/mental health issues will improve   Physician Treatment Plan for Secondary Diagnosis: Principal Problem:   MDD (major depressive disorder)   Prolonged grief   ADHD   I certify that inpatient services furnished can reasonably be expected to improve the patient's condition.     Ellouise JAYSON Azure, FNP 12/25/2023, 12:11 PM

## 2023-12-25 NOTE — Group Note (Unsigned)
 Date:  12/25/2023 Time:  6:10 PM  Group Topic/Focus:  Developing a Wellness Toolbox:   The focus of this group is to help patients develop a wellness toolbox with skills and strategies to promote recovery upon discharge. Emotional Education:   The focus of this group is to discuss what feelings/emotions are, and how they are experienced.  Pt explore interventions such as exercise, medication management and talk therapy to maintain therapy when d/c for healthy living in community.    Participation Level:  {BHH PARTICIPATION OZCZO:77735}  Participation Quality:  {BHH PARTICIPATION QUALITY:22265}  Affect:  {BHH AFFECT:22266}  Cognitive:  {BHH COGNITIVE:22267}  Insight: {BHH Insight2:20797}  Engagement in Group:  {BHH ENGAGEMENT IN HMNLE:77731}  Modes of Intervention:  {BHH MODES OF INTERVENTION:22269}  Additional Comments:  ***  Shane Walker 12/25/2023, 6:10 PM

## 2023-12-25 NOTE — BH Assessment (Signed)
(  Sleep Hours) - 7.5 (Any PRNs that were needed, meds refused, or side effects to meds)-  (Any disturbances and when (visitation, over night)- None (Concerns raised by the patient)- None (SI/HI/AVH)- Denies

## 2023-12-25 NOTE — Plan of Care (Signed)
   Problem: Education: Goal: Knowledge of [facility] Education information/materials will improve Outcome: Progressing Goal: Emotional status will improve Outcome: Progressing Goal: Mental status will improve Outcome: Progressing Goal: Verbalization of understanding the information provided will improve Outcome: Progressing

## 2023-12-25 NOTE — BHH Group Notes (Signed)
 BHH Group Notes:  (Nursing/MHT/Case Management/Adjunct)  Date:  12/25/2023  Time:  8:19 PM  Type of Therapy:  AA group  Participation Level:  Minimal  Participation Quality:  Appropriate  Affect:  Appropriate  Cognitive:  Appropriate  Insight:  Appropriate  Engagement in Group:  Engaged  Modes of Intervention:  Education  Summary of Progress/Problems: Attended AA meeting.  Shane Walker 12/25/2023, 8:19 PM

## 2023-12-25 NOTE — Plan of Care (Signed)
   Problem: Activity: Goal: Interest or engagement in activities will improve Outcome: Progressing Goal: Sleeping patterns will improve Outcome: Progressing   Problem: Coping: Goal: Ability to verbalize frustrations and anger appropriately will improve Outcome: Progressing Goal: Ability to demonstrate self-control will improve Outcome: Progressing   Problem: Safety: Goal: Periods of time without injury will increase Outcome: Progressing   Problem: Physical Regulation: Goal: Ability to maintain clinical measurements within normal limits will improve Outcome: Progressing

## 2023-12-25 NOTE — Progress Notes (Signed)
   12/25/23 0924  Psych Admission Type (Psych Patients Only)  Admission Status Voluntary  Psychosocial Assessment  Patient Complaints Anxiety;Depression  Eye Contact Fair  Facial Expression Anxious  Affect Appropriate to circumstance  Speech Logical/coherent  Interaction Assertive  Motor Activity Other (Comment) (wnl)  Appearance/Hygiene Unremarkable  Behavior Characteristics Appropriate to situation;Cooperative  Mood Anxious;Pleasant  Thought Process  Coherency WDL  Content WDL  Delusions None reported or observed  Perception WDL  Hallucination None reported or observed  Judgment WDL  Confusion None  Danger to Self  Current suicidal ideation? Denies  Agreement Not to Harm Self Yes  Description of Agreement Verbal  Danger to Others  Danger to Others None reported or observed

## 2023-12-25 NOTE — Progress Notes (Signed)
   12/25/23 2257  Psych Admission Type (Psych Patients Only)  Admission Status Voluntary  Psychosocial Assessment  Patient Complaints None  Eye Contact Fair  Facial Expression Anxious  Affect Appropriate to circumstance  Speech Logical/coherent  Interaction Assertive  Motor Activity Other (Comment) (WDL)  Appearance/Hygiene Unremarkable  Behavior Characteristics Appropriate to situation  Mood Pleasant  Thought Process  Coherency WDL  Content WDL  Delusions None reported or observed  Perception WDL  Hallucination None reported or observed  Judgment WDL  Confusion None  Danger to Self  Current suicidal ideation? Denies  Agreement Not to Harm Self Yes  Description of Agreement verbal  Danger to Others  Danger to Others None reported or observed

## 2023-12-25 NOTE — Group Note (Unsigned)
 Date:  12/25/2023 Time:  9:18 AM  Group Topic/Focus:  Goals Group:   The focus of this group is to help patients establish daily goals to achieve during treatment and discuss how the patient can incorporate goal setting into their daily lives to aide in recovery.     Participation Level:  {BHH PARTICIPATION OZCZO:77735}  Participation Quality:  {BHH PARTICIPATION QUALITY:22265}  Affect:  {BHH AFFECT:22266}  Cognitive:  {BHH COGNITIVE:22267}  Insight: {BHH Insight2:20797}  Engagement in Group:  {BHH ENGAGEMENT IN HMNLE:77731}  Modes of Intervention:  {BHH MODES OF INTERVENTION:22269}  Additional Comments:  ***  Shane Walker 12/25/2023, 9:18 AM

## 2023-12-25 NOTE — BHH Suicide Risk Assessment (Signed)
 BHH INPATIENT:  Family/Significant Other Suicide Prevention Education  Suicide Prevention Education:  Education Completed; Patti (brother) [phone], (name of family member/significant other) has been identified by the patient as the family member/significant other with whom the patient will be residing, and identified as the person(s) who will aid the patient in the event of a mental health crisis (suicidal ideations/suicide attempt).  With written consent from the patient, the family member/significant other has been provided the following suicide prevention education, prior to the and/or following the discharge of the patient.  Patti (brother) confirmed patient will not have access to firearms/guns/weapons after discharge. Patti reported all weapons and lethal medications have been removed from the home since patient has been admitted to the hospital. Patti reported he'd be willing to assist patient should he have a mental health crisis and need support. Patti denied having any safety concerns related to patient discharging home. He reported he'd be able to provide transportation between 9:30am-10:00am.   The suicide prevention education provided includes the following: Suicide risk factors Suicide prevention and interventions National Suicide Hotline telephone number [facility] assessment telephone number [facility] Emergency Assistance [address] [facility] and/or Residential Mobile Crisis Unit telephone number  Request made of family/significant other to: Remove weapons (e.g., guns, rifles, knives), all items previously/currently identified as safety concern.   Remove drugs/medications (over-the-counter, prescriptions, illicit drugs), all items previously/currently identified as a safety concern.  The family member/significant other verbalizes understanding of the suicide prevention education information provided.  The family member/significant other agrees to  remove the items of safety concern listed above.  Abhinav Mayorquin M Tabbitha Janvrin, LCSWA 12/25/2023, 3:01 PM

## 2023-12-26 DIAGNOSIS — F322 Major depressive disorder, single episode, severe without psychotic features: Principal | ICD-10-CM

## 2023-12-26 MED ORDER — HYDROXYZINE HCL 25 MG PO TABS: 25.0000 mg | ORAL_TABLET | Freq: Three times a day (TID) | ORAL | 0 refills | Status: AC | PRN

## 2023-12-26 MED ORDER — FLUOXETINE HCL 20 MG PO CAPS: 20.0000 mg | ORAL_CAPSULE | Freq: Every day | ORAL | 0 refills | Status: AC

## 2023-12-26 NOTE — Plan of Care (Signed)
   Problem: Education: Goal: Mental status will improve Outcome: Progressing   Problem: Education: Goal: Verbalization of understanding the information provided will improve Outcome: Progressing
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
## 2023-12-26 NOTE — Plan of Care (Signed)
  Problem: Activity: Goal: Interest or engagement in activities will improve Outcome: Progressing Goal: Sleeping patterns will improve Outcome: Progressing   Problem: Coping: Goal: Ability to verbalize frustrations and anger appropriately will improve Outcome: Progressing Goal: Ability to demonstrate self-control will improve Outcome: Progressing   Problem: Health Behavior/Discharge Planning: Goal: Identification of resources available to assist in meeting health care needs will improve Outcome: Progressing Goal: Compliance with treatment plan for underlying cause of condition will improve Outcome: Progressing   Problem: Physical Regulation: Goal: Ability to maintain clinical measurements within normal limits will improve Outcome: Progressing

## 2023-12-26 NOTE — Progress Notes (Signed)
 Patient discharged to home accompanied by family member. Discharge instructions, all required discharge documents and information about follow-up appointment given to pt with verbalization of understanding. All personal belongings returned to pt at time of discharge. Pt escorted to lobby by RN at 1002.  12/26/23 0900  Psych Admission Type (Psych Patients Only)  Admission Status Voluntary  Psychosocial Assessment  Patient Complaints None  Eye Contact Fair  Facial Expression Animated  Affect Appropriate to circumstance  Speech Logical/coherent  Interaction Assertive  Motor Activity Other (Comment) (WNL)  Appearance/Hygiene Unremarkable  Behavior Characteristics Appropriate to situation  Mood Pleasant  Thought Process  Coherency WDL  Content WDL  Delusions None reported or observed  Perception WDL  Hallucination None reported or observed  Judgment WDL  Confusion None  Danger to Self  Current suicidal ideation? Denies  Agreement Not to Harm Self Yes  Description of Agreement Verbal  Danger to Others  Danger to Others None reported or observed

## 2023-12-26 NOTE — Progress Notes (Signed)
(  Sleep Hours) -  8 hours (Any PRNs that were needed, meds refused, or side effects to meds)- None (Any disturbances and when (visitation, overnight)- None (Concerns raised by the patient)- None (SI/HI/AVH)- Denies

## 2023-12-26 NOTE — [facility] Suicide Risk Assessment (Signed)
 [facility] Discharge Suicide Risk Assessment   Principal Problem: MDD (major depressive disorder) Discharge Diagnoses: Principal Problem:   MDD (major depressive disorder)  Demographic Factors:  Male, Adolescent or young adult, and Caucasian  Loss Factors: Loss of significant relationship  Historical Factors: Impulsivity  Risk Reduction Factors:   Sense of responsibility to family, Living with another person, especially a relative, and Positive social support  Continued Clinical Symptoms:  MDD  Cognitive Features That Contribute To Risk:  None    Suicide Risk:  Mild:  Suicidal ideation of limited frequency, intensity, duration, and specificity.  There are no identifiable plans, no associated intent, mild dysphoria and related symptoms, good self-control (both objective and subjective assessment), few other risk factors, and identifiable protective factors, including available and accessible social support.   Follow-up Information     [facility]. Go on 01/05/2024.   Specialty: Behavioral Health Why: You have an appointment for therapy services on 01/05/24 at 11:00 am , and also for medication management services on 01/05/24 at 11:00 am , in person. * Please call asap to confirm these appointments. Substance abuse intensive outpatient therapy services are also available.  * Please call to confirm these appts. Contact information: [address] [place] [address] [phone]                  Shane DELENA Salmon, DO 12/26/2023, 9:30 AM

## 2023-12-26 NOTE — Group Note (Signed)
 Date:  12/26/2023 Time:  9:39 AM  Group Topic/Focus:  Goals Group:   The focus of this group is to help patients establish daily goals to achieve during treatment and discuss how the patient can incorporate goal setting into their daily lives to aide in recovery. Orientation:   The focus of this group is to educate the patient on the purpose and policies of crisis stabilization and provide a format to answer questions about their admission.  The group details unit policies and expectations of patients while admitted.    Participation Level:  Active  Participation Quality:  Appropriate, Sharing, and Supportive  Affect:  Appropriate and Tearful  Cognitive:  Appropriate  Insight: Appropriate  Engagement in Group:  Engaged  Modes of Intervention:  Discussion  Additional Comments:  Patient really opened up and shared during group which led to tearfulness. Patient was engaged in all of group and thanked everyone for hearing  him out and letting him get everything out.   Emelina Hinch R Janie Strothman 12/26/2023, 9:39 AM

## 2023-12-26 NOTE — Progress Notes (Signed)
  [facility] Adult Case Management Discharge Plan :  Will you be returning to the same living situation after discharge:  Yes,  pt returning home at discharge At discharge, do you have transportation home?: Yes,  pts brother will pick pt up around 10AM Do you have the ability to pay for your medications: Yes,  pt has active health insurance coverage  Release of information consent forms completed and in the chart;  Patient's signature needed at discharge.  Patient to Follow up at:  Follow-up Information     [facility]. Go on 01/05/2024.   Specialty: Behavioral Health Why: You have an appointment for therapy services on 01/05/24 at 11:00 am , and also for medication management services on 01/05/24 at 11:00 am , in person. * Please call asap to confirm these appointments. Substance abuse intensive outpatient therapy services are also available.  * Please call to confirm these appts. Contact information: [address] [place] [address] [phone]                 Next level of care provider has access to [facility] Link:no  Safety Planning and Suicide Prevention discussed: Yes,  Patti (brother) [phone]     Has patient been referred to the Quitline?: Patient does not use tobacco/nicotine products  Patient has been referred for addiction treatment: No known substance use disorder.  Jenkins LULLA Primer, LCSWA 12/26/2023, 9:29 AM

## 2023-12-26 NOTE — Group Note (Signed)
 Date:  12/26/2023 Time:  10:19 AM  Group Topic/Focus: Social Wellness. Gratitude and Strengths  Psychoeducation on the benefits of gratitude and strengths-based thinking for social and emotional wellness. Guided group discussion: "What are you grateful for today?" Strengths identification activity: Participants listed 3 personal strengths and shared examples of when these were demonstrated. Partnered peer sharing to deepen connection and practice active listening. Group reflection: How gratitude and strengths can be used in daily life to improve social interactions.     Participation Level:  Active  Participation Quality:  Appropriate  Affect:  Appropriate  Cognitive:  Appropriate  Insight: Appropriate  Engagement in Group:  Engaged and Supportive  Modes of Intervention:  Discussion, Socialization, and Support  Additional Comments:    Ajeenah Heiny R Keigan Girten 12/26/2023, 10:19 AM
